# Patient Record
Sex: Female | Born: 1935 | Race: Black or African American | Hispanic: No | Smoking: Never smoker
Health system: Southern US, Community
[De-identification: ages and names within clinical notes are randomized; demographics above are authoritative.]

## PROBLEM LIST (undated history)

## (undated) DIAGNOSIS — G4700 Insomnia, unspecified: Secondary | ICD-10-CM

## (undated) DIAGNOSIS — M1990 Unspecified osteoarthritis, unspecified site: Secondary | ICD-10-CM

## (undated) DIAGNOSIS — M76899 Other specified enthesopathies of unspecified lower limb, excluding foot: Secondary | ICD-10-CM

## (undated) DIAGNOSIS — M8580 Other specified disorders of bone density and structure, unspecified site: Secondary | ICD-10-CM

## (undated) DIAGNOSIS — E785 Hyperlipidemia, unspecified: Secondary | ICD-10-CM

## (undated) DIAGNOSIS — D509 Iron deficiency anemia, unspecified: Secondary | ICD-10-CM

## (undated) DIAGNOSIS — I10 Essential (primary) hypertension: Secondary | ICD-10-CM

## (undated) DIAGNOSIS — N823 Fistula of vagina to large intestine: Secondary | ICD-10-CM

## (undated) HISTORY — DX: Other specified enthesopathies of unspecified lower limb, excluding foot: M76.899

## (undated) HISTORY — PX: KNEE ARTHROSCOPY: SUR90

## (undated) HISTORY — DX: Insomnia, unspecified: G47.00

## (undated) HISTORY — DX: Hyperlipidemia, unspecified: E78.5

## (undated) HISTORY — DX: Essential (primary) hypertension: I10

## (undated) HISTORY — DX: Other specified disorders of bone density and structure, unspecified site: M85.80

## (undated) HISTORY — DX: Fistula of vagina to large intestine: N82.3

## (undated) HISTORY — DX: Unspecified osteoarthritis, unspecified site: M19.90

## (undated) HISTORY — DX: Iron deficiency anemia, unspecified: D50.9

## (undated) HISTORY — PX: CATARACT EXTRACTION, BILATERAL: SHX1313

---

## 1997-11-11 ENCOUNTER — Encounter: Admission: RE | Admit: 1997-11-11 | Discharge: 1998-02-09 | Payer: Self-pay | Admitting: Neurology

## 2002-01-20 ENCOUNTER — Encounter: Admission: RE | Admit: 2002-01-20 | Discharge: 2002-01-20 | Payer: Self-pay | Admitting: Internal Medicine

## 2002-01-28 ENCOUNTER — Encounter: Admission: RE | Admit: 2002-01-28 | Discharge: 2002-01-28 | Payer: Self-pay | Admitting: Internal Medicine

## 2002-01-29 ENCOUNTER — Encounter: Payer: Self-pay | Admitting: Internal Medicine

## 2002-01-29 ENCOUNTER — Ambulatory Visit (HOSPITAL_COMMUNITY): Admission: RE | Admit: 2002-01-29 | Discharge: 2002-01-29 | Payer: Self-pay | Admitting: Internal Medicine

## 2002-03-10 ENCOUNTER — Encounter: Admission: RE | Admit: 2002-03-10 | Discharge: 2002-03-10 | Payer: Self-pay | Admitting: Internal Medicine

## 2002-03-24 ENCOUNTER — Encounter: Admission: RE | Admit: 2002-03-24 | Discharge: 2002-03-24 | Payer: Self-pay | Admitting: Internal Medicine

## 2002-06-09 ENCOUNTER — Encounter: Admission: RE | Admit: 2002-06-09 | Discharge: 2002-06-09 | Payer: Self-pay | Admitting: Internal Medicine

## 2002-06-23 ENCOUNTER — Encounter: Admission: RE | Admit: 2002-06-23 | Discharge: 2002-06-23 | Payer: Self-pay | Admitting: Internal Medicine

## 2002-07-21 ENCOUNTER — Encounter: Admission: RE | Admit: 2002-07-21 | Discharge: 2002-07-21 | Payer: Self-pay | Admitting: Internal Medicine

## 2002-09-21 ENCOUNTER — Ambulatory Visit (HOSPITAL_COMMUNITY): Admission: RE | Admit: 2002-09-21 | Discharge: 2002-09-21 | Payer: Self-pay | Admitting: Gastroenterology

## 2002-09-21 LAB — HM COLONOSCOPY

## 2003-01-18 ENCOUNTER — Encounter: Admission: RE | Admit: 2003-01-18 | Discharge: 2003-01-18 | Payer: Self-pay | Admitting: Internal Medicine

## 2003-01-19 ENCOUNTER — Encounter: Admission: RE | Admit: 2003-01-19 | Discharge: 2003-01-19 | Payer: Self-pay | Admitting: Internal Medicine

## 2003-07-30 ENCOUNTER — Encounter: Admission: RE | Admit: 2003-07-30 | Discharge: 2003-07-30 | Payer: Self-pay | Admitting: Internal Medicine

## 2003-08-02 ENCOUNTER — Ambulatory Visit (HOSPITAL_COMMUNITY): Admission: RE | Admit: 2003-08-02 | Discharge: 2003-08-02 | Payer: Self-pay | Admitting: Family Medicine

## 2003-09-30 ENCOUNTER — Encounter: Admission: RE | Admit: 2003-09-30 | Discharge: 2003-09-30 | Payer: Self-pay | Admitting: Internal Medicine

## 2003-11-02 ENCOUNTER — Encounter: Admission: RE | Admit: 2003-11-02 | Discharge: 2003-11-02 | Payer: Self-pay | Admitting: Internal Medicine

## 2003-11-02 ENCOUNTER — Ambulatory Visit (HOSPITAL_COMMUNITY): Admission: RE | Admit: 2003-11-02 | Discharge: 2003-11-02 | Payer: Self-pay | Admitting: Internal Medicine

## 2003-11-18 ENCOUNTER — Encounter: Admission: RE | Admit: 2003-11-18 | Discharge: 2003-11-18 | Payer: Self-pay | Admitting: Internal Medicine

## 2003-12-24 ENCOUNTER — Ambulatory Visit (HOSPITAL_COMMUNITY): Admission: RE | Admit: 2003-12-24 | Discharge: 2003-12-24 | Payer: Self-pay | Admitting: Internal Medicine

## 2003-12-24 ENCOUNTER — Encounter: Admission: RE | Admit: 2003-12-24 | Discharge: 2003-12-24 | Payer: Self-pay | Admitting: Internal Medicine

## 2004-06-13 ENCOUNTER — Ambulatory Visit: Payer: Self-pay | Admitting: Internal Medicine

## 2004-06-27 ENCOUNTER — Ambulatory Visit: Payer: Self-pay | Admitting: Internal Medicine

## 2004-07-18 ENCOUNTER — Ambulatory Visit: Payer: Self-pay | Admitting: Internal Medicine

## 2004-09-20 ENCOUNTER — Ambulatory Visit: Payer: Self-pay | Admitting: Internal Medicine

## 2004-09-29 ENCOUNTER — Ambulatory Visit (HOSPITAL_COMMUNITY): Admission: RE | Admit: 2004-09-29 | Discharge: 2004-09-29 | Payer: Self-pay | Admitting: Internal Medicine

## 2004-10-04 ENCOUNTER — Ambulatory Visit: Payer: Self-pay | Admitting: Internal Medicine

## 2004-10-13 ENCOUNTER — Ambulatory Visit: Payer: Self-pay | Admitting: Internal Medicine

## 2004-11-17 ENCOUNTER — Ambulatory Visit: Payer: Self-pay | Admitting: Internal Medicine

## 2004-11-30 ENCOUNTER — Encounter: Admission: RE | Admit: 2004-11-30 | Discharge: 2004-12-28 | Payer: Self-pay | Admitting: Internal Medicine

## 2005-02-13 ENCOUNTER — Ambulatory Visit: Payer: Self-pay | Admitting: Internal Medicine

## 2005-05-11 ENCOUNTER — Ambulatory Visit: Payer: Self-pay | Admitting: Internal Medicine

## 2005-09-04 ENCOUNTER — Ambulatory Visit: Payer: Self-pay | Admitting: Internal Medicine

## 2005-10-02 ENCOUNTER — Ambulatory Visit (HOSPITAL_COMMUNITY): Admission: RE | Admit: 2005-10-02 | Discharge: 2005-10-02 | Payer: Self-pay | Admitting: Internal Medicine

## 2006-05-09 ENCOUNTER — Encounter (INDEPENDENT_AMBULATORY_CARE_PROVIDER_SITE_OTHER): Payer: Self-pay | Admitting: Internal Medicine

## 2006-05-09 ENCOUNTER — Ambulatory Visit: Payer: Self-pay | Admitting: Internal Medicine

## 2006-05-09 LAB — CONVERTED CEMR LAB
Ferritin: 126 ng/mL (ref 10–291)
HCT: 40 % (ref 34.4–43.3)
HDL: 55 mg/dL (ref >39)
Hemoglobin: 12.5 g/dL (ref 11.7–14.8)
Iron: 61 ug/dL (ref 42–145)
LDL Cholesterol: 104 mg/dL — ABNORMAL HIGH (ref 0–99)
Leukocyte count, blood: 4.3 10*9/L (ref 3.7–10.0)
RBC: 5.51 M/uL — ABNORMAL HIGH (ref 3.79–4.96)
Saturation Ratios: 22 % (ref 20–55)
TIBC: 280 ug/dL (ref 250–470)
Total CHOL/HDL Ratio: 3.3
UIBC: 219 ug/dL

## 2006-05-14 ENCOUNTER — Ambulatory Visit (HOSPITAL_COMMUNITY): Admission: RE | Admit: 2006-05-14 | Discharge: 2006-05-14 | Payer: Self-pay | Admitting: Internal Medicine

## 2006-05-14 ENCOUNTER — Encounter (INDEPENDENT_AMBULATORY_CARE_PROVIDER_SITE_OTHER): Payer: Self-pay | Admitting: Internal Medicine

## 2006-05-27 DIAGNOSIS — J309 Allergic rhinitis, unspecified: Secondary | ICD-10-CM | POA: Insufficient documentation

## 2006-05-27 DIAGNOSIS — E785 Hyperlipidemia, unspecified: Secondary | ICD-10-CM

## 2006-05-27 DIAGNOSIS — D539 Nutritional anemia, unspecified: Secondary | ICD-10-CM | POA: Insufficient documentation

## 2006-05-27 DIAGNOSIS — M1990 Unspecified osteoarthritis, unspecified site: Secondary | ICD-10-CM | POA: Insufficient documentation

## 2006-05-27 DIAGNOSIS — M779 Enthesopathy, unspecified: Secondary | ICD-10-CM | POA: Insufficient documentation

## 2006-05-27 DIAGNOSIS — M899 Disorder of bone, unspecified: Secondary | ICD-10-CM | POA: Insufficient documentation

## 2006-05-27 DIAGNOSIS — M949 Disorder of cartilage, unspecified: Secondary | ICD-10-CM

## 2006-05-27 DIAGNOSIS — G4700 Insomnia, unspecified: Secondary | ICD-10-CM | POA: Insufficient documentation

## 2006-05-27 DIAGNOSIS — N824 Other female intestinal-genital tract fistulae: Secondary | ICD-10-CM | POA: Insufficient documentation

## 2006-05-27 DIAGNOSIS — I10 Essential (primary) hypertension: Secondary | ICD-10-CM | POA: Insufficient documentation

## 2006-09-09 ENCOUNTER — Telehealth: Payer: Self-pay | Admitting: *Deleted

## 2006-10-03 ENCOUNTER — Ambulatory Visit: Payer: Self-pay | Admitting: Internal Medicine

## 2006-10-03 ENCOUNTER — Encounter (INDEPENDENT_AMBULATORY_CARE_PROVIDER_SITE_OTHER): Payer: Self-pay | Admitting: Internal Medicine

## 2006-10-03 LAB — CONVERTED CEMR LAB
ALT: 18 units/L (ref 0–35)
AST: 22 units/L (ref 0–37)
Albumin: 4.6 g/dL (ref 3.5–5.2)
Alkaline Phosphatase: 116 units/L (ref 39–117)
CO2: 24 meq/L (ref 19–32)
Calcium: 10.1 mg/dL (ref 8.4–10.5)
Chloride: 103 meq/L (ref 96–112)
Creatinine, Ser: 0.83 mg/dL (ref 0.40–1.20)
Glucose, Bld: 83 mg/dL (ref 70–99)
HDL: 55 mg/dL (ref >39)
LDL Cholesterol: 74 mg/dL (ref 0–99)
Total Protein: 7.8 g/dL (ref 6.0–8.3)
Triglycerides: 66 mg/dL (ref <150)

## 2007-03-28 ENCOUNTER — Encounter (INDEPENDENT_AMBULATORY_CARE_PROVIDER_SITE_OTHER): Payer: Self-pay | Admitting: Internal Medicine

## 2007-03-28 ENCOUNTER — Ambulatory Visit: Payer: Self-pay | Admitting: Internal Medicine

## 2007-03-28 DIAGNOSIS — N951 Menopausal and female climacteric states: Secondary | ICD-10-CM | POA: Insufficient documentation

## 2007-03-29 LAB — CONVERTED CEMR LAB
ALT: 18 units/L (ref 0–35)
AST: 21 units/L (ref 0–37)
Albumin: 4.7 g/dL (ref 3.5–5.2)
BUN: 18 mg/dL (ref 6–23)
CO2: 25 meq/L (ref 19–32)
Creatinine, Ser: 1.09 mg/dL (ref 0.40–1.20)
HCT: 35.6 % — ABNORMAL LOW (ref 36.0–46.0)
MCV: 73.6 fL — ABNORMAL LOW (ref 78.0–100.0)
Platelets: 315 10*3/uL (ref 150–400)
RDW: 15.2 % — ABNORMAL HIGH (ref 11.5–14.0)
Total Bilirubin: 0.4 mg/dL (ref 0.3–1.2)
Total Protein: 7.7 g/dL (ref 6.0–8.3)

## 2007-03-31 ENCOUNTER — Ambulatory Visit: Payer: Self-pay | Admitting: Internal Medicine

## 2007-03-31 ENCOUNTER — Encounter (INDEPENDENT_AMBULATORY_CARE_PROVIDER_SITE_OTHER): Payer: Self-pay | Admitting: Internal Medicine

## 2007-03-31 LAB — CONVERTED CEMR LAB
HDL: 53 mg/dL (ref >39)
RBC Folate: 731 ng/mL — ABNORMAL HIGH (ref 180–600)
Saturation Ratios: 26 % (ref 20–55)
Total CHOL/HDL Ratio: 2.3
VLDL: 16 mg/dL (ref 0–40)
Vitamin B-12: 972 pg/mL — ABNORMAL HIGH (ref 211–911)

## 2007-04-14 ENCOUNTER — Ambulatory Visit: Payer: Self-pay | Admitting: *Deleted

## 2007-04-16 ENCOUNTER — Ambulatory Visit: Payer: Self-pay | Admitting: Infectious Diseases

## 2007-09-02 ENCOUNTER — Ambulatory Visit: Payer: Self-pay | Admitting: Internal Medicine

## 2007-09-02 DIAGNOSIS — H9209 Otalgia, unspecified ear: Secondary | ICD-10-CM | POA: Insufficient documentation

## 2007-09-22 ENCOUNTER — Encounter (INDEPENDENT_AMBULATORY_CARE_PROVIDER_SITE_OTHER): Payer: Self-pay | Admitting: Internal Medicine

## 2007-09-22 ENCOUNTER — Ambulatory Visit: Payer: Self-pay | Admitting: Hospitalist

## 2007-09-22 LAB — CONVERTED CEMR LAB
ALT: 20 units/L (ref 0–35)
CO2: 22 meq/L (ref 19–32)
Calcium: 9.6 mg/dL (ref 8.4–10.5)
Chloride: 108 meq/L (ref 96–112)
Cholesterol: 107 mg/dL (ref 0–200)
Creatinine, Ser: 0.87 mg/dL (ref 0.40–1.20)
Glucose, Bld: 92 mg/dL (ref 70–99)
Total CHOL/HDL Ratio: 2
Total Protein: 7.4 g/dL (ref 6.0–8.3)
Triglycerides: 48 mg/dL (ref <150)

## 2008-02-12 ENCOUNTER — Telehealth (INDEPENDENT_AMBULATORY_CARE_PROVIDER_SITE_OTHER): Payer: Self-pay | Admitting: Internal Medicine

## 2008-02-24 ENCOUNTER — Encounter (INDEPENDENT_AMBULATORY_CARE_PROVIDER_SITE_OTHER): Payer: Self-pay | Admitting: Internal Medicine

## 2008-04-06 ENCOUNTER — Encounter (INDEPENDENT_AMBULATORY_CARE_PROVIDER_SITE_OTHER): Payer: Self-pay | Admitting: Internal Medicine

## 2008-04-06 ENCOUNTER — Ambulatory Visit: Payer: Self-pay | Admitting: Internal Medicine

## 2008-04-27 ENCOUNTER — Telehealth (INDEPENDENT_AMBULATORY_CARE_PROVIDER_SITE_OTHER): Payer: Self-pay | Admitting: Internal Medicine

## 2008-10-06 ENCOUNTER — Ambulatory Visit: Payer: Self-pay | Admitting: Internal Medicine

## 2008-10-06 ENCOUNTER — Encounter (INDEPENDENT_AMBULATORY_CARE_PROVIDER_SITE_OTHER): Payer: Self-pay | Admitting: Internal Medicine

## 2008-10-07 LAB — CONVERTED CEMR LAB
Albumin: 4.7 g/dL (ref 3.5–5.2)
Alkaline Phosphatase: 100 units/L (ref 39–117)
CO2: 22 meq/L (ref 19–32)
Chloride: 103 meq/L (ref 96–112)
GFR calc non Af Amer: 60 mL/min — ABNORMAL LOW (ref >60)
Glucose, Bld: 82 mg/dL (ref 70–99)
HCT: 35.3 % — ABNORMAL LOW (ref 36.0–46.0)
Platelets: 279 10*3/uL (ref 150–400)
Potassium: 3.9 meq/L (ref 3.5–5.3)
RDW: 14.6 % (ref 11.5–15.5)
Sodium: 140 meq/L (ref 135–145)
Total Protein: 7.7 g/dL (ref 6.0–8.3)

## 2008-10-18 ENCOUNTER — Ambulatory Visit (HOSPITAL_COMMUNITY): Admission: RE | Admit: 2008-10-18 | Discharge: 2008-10-18 | Payer: Self-pay | Admitting: Internal Medicine

## 2008-11-08 ENCOUNTER — Encounter (INDEPENDENT_AMBULATORY_CARE_PROVIDER_SITE_OTHER): Payer: Self-pay | Admitting: Internal Medicine

## 2008-11-08 ENCOUNTER — Ambulatory Visit: Payer: Self-pay | Admitting: Internal Medicine

## 2008-11-08 LAB — CONVERTED CEMR LAB
LDL Cholesterol: 67 mg/dL (ref 0–99)
VLDL: 12 mg/dL (ref 0–40)

## 2009-07-12 ENCOUNTER — Telehealth: Payer: Self-pay | Admitting: Internal Medicine

## 2009-10-25 ENCOUNTER — Ambulatory Visit (HOSPITAL_COMMUNITY): Admission: RE | Admit: 2009-10-25 | Discharge: 2009-10-25 | Payer: Self-pay | Admitting: Internal Medicine

## 2010-08-08 NOTE — Progress Notes (Signed)
Summary: refill/ hla  Phone Note Refill Request Message from:  Patient on July 12, 2009 4:24 PM  Refills Requested: Medication #1:  QUINAPRIL HCL 40 MG TABS Take 1 tablet by mouth daily Initial call taken by: Marin Roberts RN,  July 12, 2009 4:24 PM  Follow-up for Phone Call       Follow-up by: Blondell Reveal MD,  July 29, 2009 7:24 AM    Prescriptions: QUINAPRIL HCL 40 MG TABS (QUINAPRIL HCL) Take 1 tablet by mouth daily  #30 x 5   Entered and Authorized by:   Blondell Reveal MD   Signed by:   Blondell Reveal MD on 07/29/2009   Method used:   Electronically to        [facility] [address] [phone]* (retail)       [address]       [place], [place]  [address]       Ph: [phone]       Fax: [phone]   RxID:   [id number]

## 2010-08-14 ENCOUNTER — Ambulatory Visit: Payer: Medicare PPO | Attending: Physical Therapy | Admitting: Physical Therapy

## 2010-10-27 ENCOUNTER — Encounter: Payer: Self-pay | Admitting: Ophthalmology

## 2010-11-15 ENCOUNTER — Other Ambulatory Visit (HOSPITAL_COMMUNITY): Payer: Self-pay | Admitting: Internal Medicine

## 2010-11-15 DIAGNOSIS — Z1231 Encounter for screening mammogram for malignant neoplasm of breast: Secondary | ICD-10-CM

## 2010-11-24 ENCOUNTER — Ambulatory Visit (HOSPITAL_COMMUNITY)
Admission: RE | Admit: 2010-11-24 | Discharge: 2010-11-24 | Disposition: A | Discharge status: Home / Self Care | Payer: Medicare PPO | Source: Ambulatory Visit | Attending: Internal Medicine | Admitting: Internal Medicine

## 2010-11-24 DIAGNOSIS — Z1231 Encounter for screening mammogram for malignant neoplasm of breast: Secondary | ICD-10-CM | POA: Insufficient documentation

## 2010-11-24 NOTE — Op Note (Signed)
   NAME:  [name]                   ACCOUNT NO.:  [id number]   MEDICAL RECORD NO.:  [id number]                   PATIENT TYPE:  AMB   LOCATION:  ENDO                                 FACILITY:  [facility]   PHYSICIAN:  James L. Malon Kindle., M.D.          DATE OF BIRTH:  [birth year]   DATE OF PROCEDURE:  09/21/2002  DATE OF DISCHARGE:                                 OPERATIVE REPORT   PROCEDURE:  Colonoscopy.   MEDICATIONS:  Fentanyl 100 mcg, Versed 10 mg IV.   INDICATIONS:  Colon cancer screening.   ENDOSCOPE:  Olympus pediatric adjustable colonoscope.   DESCRIPTION OF PROCEDURE:  The procedure had been explained to the patient  and consent obtained.  With the patient in the left lateral decubitus  position, we attempted to insert the scope.  Multiple problems were  encountered.  We had problems finding the rectum and continued going in the  vagina even with a digital exam.  After finally being able to have the  patient bring her legs up, carefully inspect the area with a digital finger,  it was determined that the patient's rectum and vagina had a common channel.  We were then subsequently able to enter the rectum and were able to advance  easily to the cecum using abdominal pressure and position changes.  The  scope was withdrawn and the cecum, ascending colon, transverse colon,  descending and sigmoid colon were seen well.  No polyps or other lesions  seen.  We came down in the rectum, and the rectum was free of polyps.  As we  withdrew the scope from the rectum, the patient was seen to have a common  channel with two lumens, and this was photographed.  No other lesions were  seen in the rectum.  The scope was withdrawn.  The patient tolerated the  procedure well, was maintained on low-flow oxygen and pulse oximeter  throughout the procedure.   ASSESSMENT:  1. No evidence of colon polyps or other lesions.  2. Common rectovaginal channel.   PLAN:  Will plan to have  the patient see Ladell Pier, M.D., at the [facility] for a pelvic exam.                                               Fayrene Fearing L. Malon Kindle., M.D.    Waldron Session  D:  09/21/2002  T:  09/21/2002  Job:  [id number]   cc:   Ladell Pier, M.D.  [address]-Internal Medicine Resident  [place], [place] [address]  Fax: [phone]

## 2010-12-06 ENCOUNTER — Emergency Department (HOSPITAL_COMMUNITY): Payer: Medicare PPO

## 2010-12-06 ENCOUNTER — Emergency Department (HOSPITAL_COMMUNITY)
Admission: EM | Admit: 2010-12-06 | Discharge: 2010-12-06 | Disposition: A | Discharge status: Home / Self Care | Payer: Medicare PPO | Attending: Emergency Medicine | Admitting: Emergency Medicine

## 2010-12-06 DIAGNOSIS — R5383 Other fatigue: Secondary | ICD-10-CM | POA: Insufficient documentation

## 2010-12-06 DIAGNOSIS — R42 Dizziness and giddiness: Secondary | ICD-10-CM | POA: Insufficient documentation

## 2010-12-06 DIAGNOSIS — R5381 Other malaise: Secondary | ICD-10-CM | POA: Insufficient documentation

## 2010-12-06 DIAGNOSIS — I10 Essential (primary) hypertension: Secondary | ICD-10-CM | POA: Insufficient documentation

## 2010-12-06 DIAGNOSIS — H81399 Other peripheral vertigo, unspecified ear: Secondary | ICD-10-CM | POA: Insufficient documentation

## 2010-12-06 LAB — POCT I-STAT, CHEM 8
Calcium, Ion: 1.16 mmol/L (ref 1.12–1.32)
Chloride: 106 mEq/L (ref 96–112)
Creatinine, Ser: 1.1 mg/dL (ref 0.4–1.2)
Glucose, Bld: 97 mg/dL (ref 70–99)
HCT: 35 % — ABNORMAL LOW (ref 36.0–46.0)

## 2011-07-28 ENCOUNTER — Emergency Department (HOSPITAL_COMMUNITY): Payer: No Typology Code available for payment source

## 2011-07-28 ENCOUNTER — Emergency Department (HOSPITAL_COMMUNITY)
Admission: EM | Admit: 2011-07-28 | Discharge: 2011-07-28 | Disposition: A | Discharge status: Home / Self Care | Payer: No Typology Code available for payment source | Attending: Emergency Medicine | Admitting: Emergency Medicine

## 2011-07-28 DIAGNOSIS — E785 Hyperlipidemia, unspecified: Secondary | ICD-10-CM | POA: Insufficient documentation

## 2011-07-28 DIAGNOSIS — Z79899 Other long term (current) drug therapy: Secondary | ICD-10-CM | POA: Insufficient documentation

## 2011-07-28 DIAGNOSIS — I10 Essential (primary) hypertension: Secondary | ICD-10-CM | POA: Insufficient documentation

## 2011-07-28 DIAGNOSIS — M5380 Other specified dorsopathies, site unspecified: Secondary | ICD-10-CM | POA: Insufficient documentation

## 2011-07-28 DIAGNOSIS — M545 Low back pain, unspecified: Secondary | ICD-10-CM | POA: Insufficient documentation

## 2011-07-28 DIAGNOSIS — M1990 Unspecified osteoarthritis, unspecified site: Secondary | ICD-10-CM | POA: Insufficient documentation

## 2011-07-28 DIAGNOSIS — M542 Cervicalgia: Secondary | ICD-10-CM | POA: Insufficient documentation

## 2011-07-28 MED ORDER — SODIUM CHLORIDE 0.9 % IV BOLUS (SEPSIS)
1000.0000 mL | Freq: Once | INTRAVENOUS | Status: AC
  Administered 2011-07-28: 1000 mL via INTRAVENOUS

## 2011-07-28 MED ORDER — METHOCARBAMOL 500 MG PO TABS: 500.0000 mg | ORAL_TABLET | Freq: Two times a day (BID) | ORAL | Status: AC

## 2011-07-28 MED ORDER — MORPHINE SULFATE 2 MG/ML IJ SOLN
2.0000 mg | Freq: Once | INTRAMUSCULAR | Status: AC
  Administered 2011-07-28: 2 mg via INTRAVENOUS
  Filled 2011-07-28: qty 1

## 2011-07-28 MED ORDER — OXYCODONE-ACETAMINOPHEN 5-325 MG PO TABS: 2.0000 | ORAL_TABLET | ORAL | Status: AC | PRN
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
## 2011-07-28 NOTE — ED Notes (Signed)
Family at bedside. 
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
## 2011-08-20 ENCOUNTER — Other Ambulatory Visit: Payer: Self-pay | Admitting: Internal Medicine

## 2011-08-20 DIAGNOSIS — M5430 Sciatica, unspecified side: Secondary | ICD-10-CM

## 2011-08-20 DIAGNOSIS — M25552 Pain in left hip: Secondary | ICD-10-CM

## 2011-08-24 ENCOUNTER — Other Ambulatory Visit: Payer: Medicare PPO

## 2011-08-24 ENCOUNTER — Inpatient Hospital Stay: Admission: RE | Admit: 2011-08-24 | Payer: Medicare PPO | Source: Ambulatory Visit

## 2011-10-25 ENCOUNTER — Ambulatory Visit
Admission: RE | Admit: 2011-10-25 | Discharge: 2011-10-25 | Disposition: A | Discharge status: Home / Self Care | Payer: Medicare PPO | Source: Ambulatory Visit | Attending: Internal Medicine | Admitting: Internal Medicine

## 2011-10-25 DIAGNOSIS — M25552 Pain in left hip: Secondary | ICD-10-CM

## 2011-10-25 DIAGNOSIS — M5430 Sciatica, unspecified side: Secondary | ICD-10-CM

## 2012-05-16 ENCOUNTER — Emergency Department (INDEPENDENT_AMBULATORY_CARE_PROVIDER_SITE_OTHER)
Admission: EM | Admit: 2012-05-16 | Discharge: 2012-05-16 | Disposition: A | Discharge status: Home / Self Care | Payer: Medicare PPO | Source: Home / Self Care

## 2012-05-16 ENCOUNTER — Encounter (HOSPITAL_COMMUNITY): Payer: Self-pay | Admitting: Emergency Medicine

## 2012-05-16 ENCOUNTER — Emergency Department (INDEPENDENT_AMBULATORY_CARE_PROVIDER_SITE_OTHER): Payer: Medicare PPO

## 2012-05-16 DIAGNOSIS — L03019 Cellulitis of unspecified finger: Secondary | ICD-10-CM

## 2012-05-16 DIAGNOSIS — S6000XA Contusion of unspecified finger without damage to nail, initial encounter: Secondary | ICD-10-CM

## 2012-05-16 DIAGNOSIS — S60229A Contusion of unspecified hand, initial encounter: Secondary | ICD-10-CM

## 2012-05-16 DIAGNOSIS — L03011 Cellulitis of right finger: Secondary | ICD-10-CM

## 2012-05-16 MED ORDER — CEPHALEXIN 500 MG PO CAPS: 500.0000 mg | ORAL_CAPSULE | Freq: Three times a day (TID) | ORAL | Status: DC
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
## 2012-05-16 NOTE — ED Notes (Signed)
Patient reports falling one week ago.  Says she was coming into house, lost balance and fell.  Reports right hand pain since then, right palm and right thumb pain, with minimal discoloration to tip of thumb

## 2012-05-18 NOTE — ED Provider Notes (Signed)
Medical screening examination/treatment/procedure(s) were performed by non-physician practitioner and as supervising physician I was immediately available for consultation/collaboration.   MORENO-COLL,Cathy Crounse; MD   Arzu Mcgaughey Moreno-Coll, MD 05/18/12 0828 

## 2012-11-19 ENCOUNTER — Other Ambulatory Visit: Payer: Self-pay | Admitting: Internal Medicine

## 2012-11-19 ENCOUNTER — Ambulatory Visit
Admission: RE | Admit: 2012-11-19 | Discharge: 2012-11-19 | Disposition: A | Discharge status: Home / Self Care | Payer: Medicare PPO | Source: Ambulatory Visit | Attending: Internal Medicine | Admitting: Internal Medicine

## 2012-11-19 DIAGNOSIS — R52 Pain, unspecified: Secondary | ICD-10-CM

## 2013-10-29 IMAGING — CR DG CHEST 2V
2 series · 2 of 2 positions shown · non-contrast
Comparison: None

CLINICAL DATA: Right rib pain, no injury

CHEST - 2 VIEW

[w chest pa]
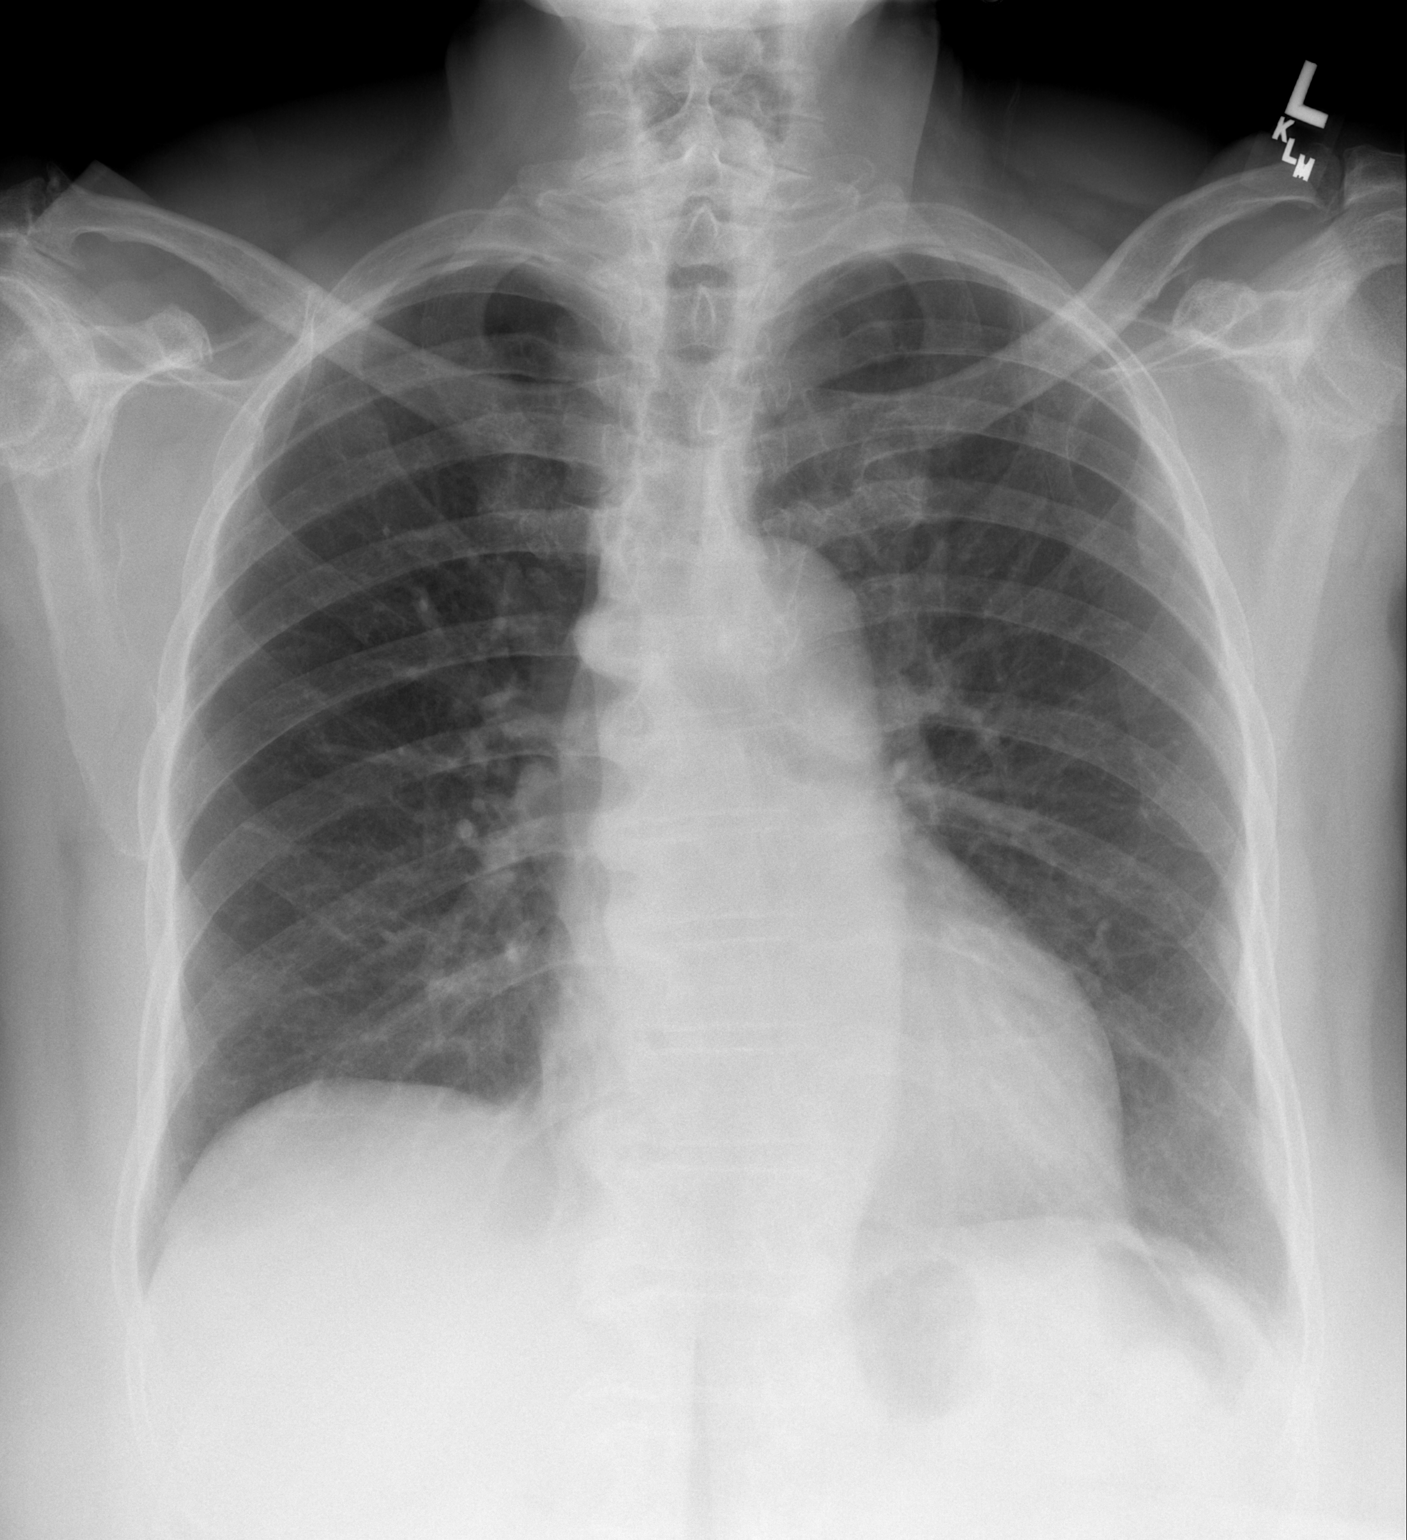

[w chest lat]
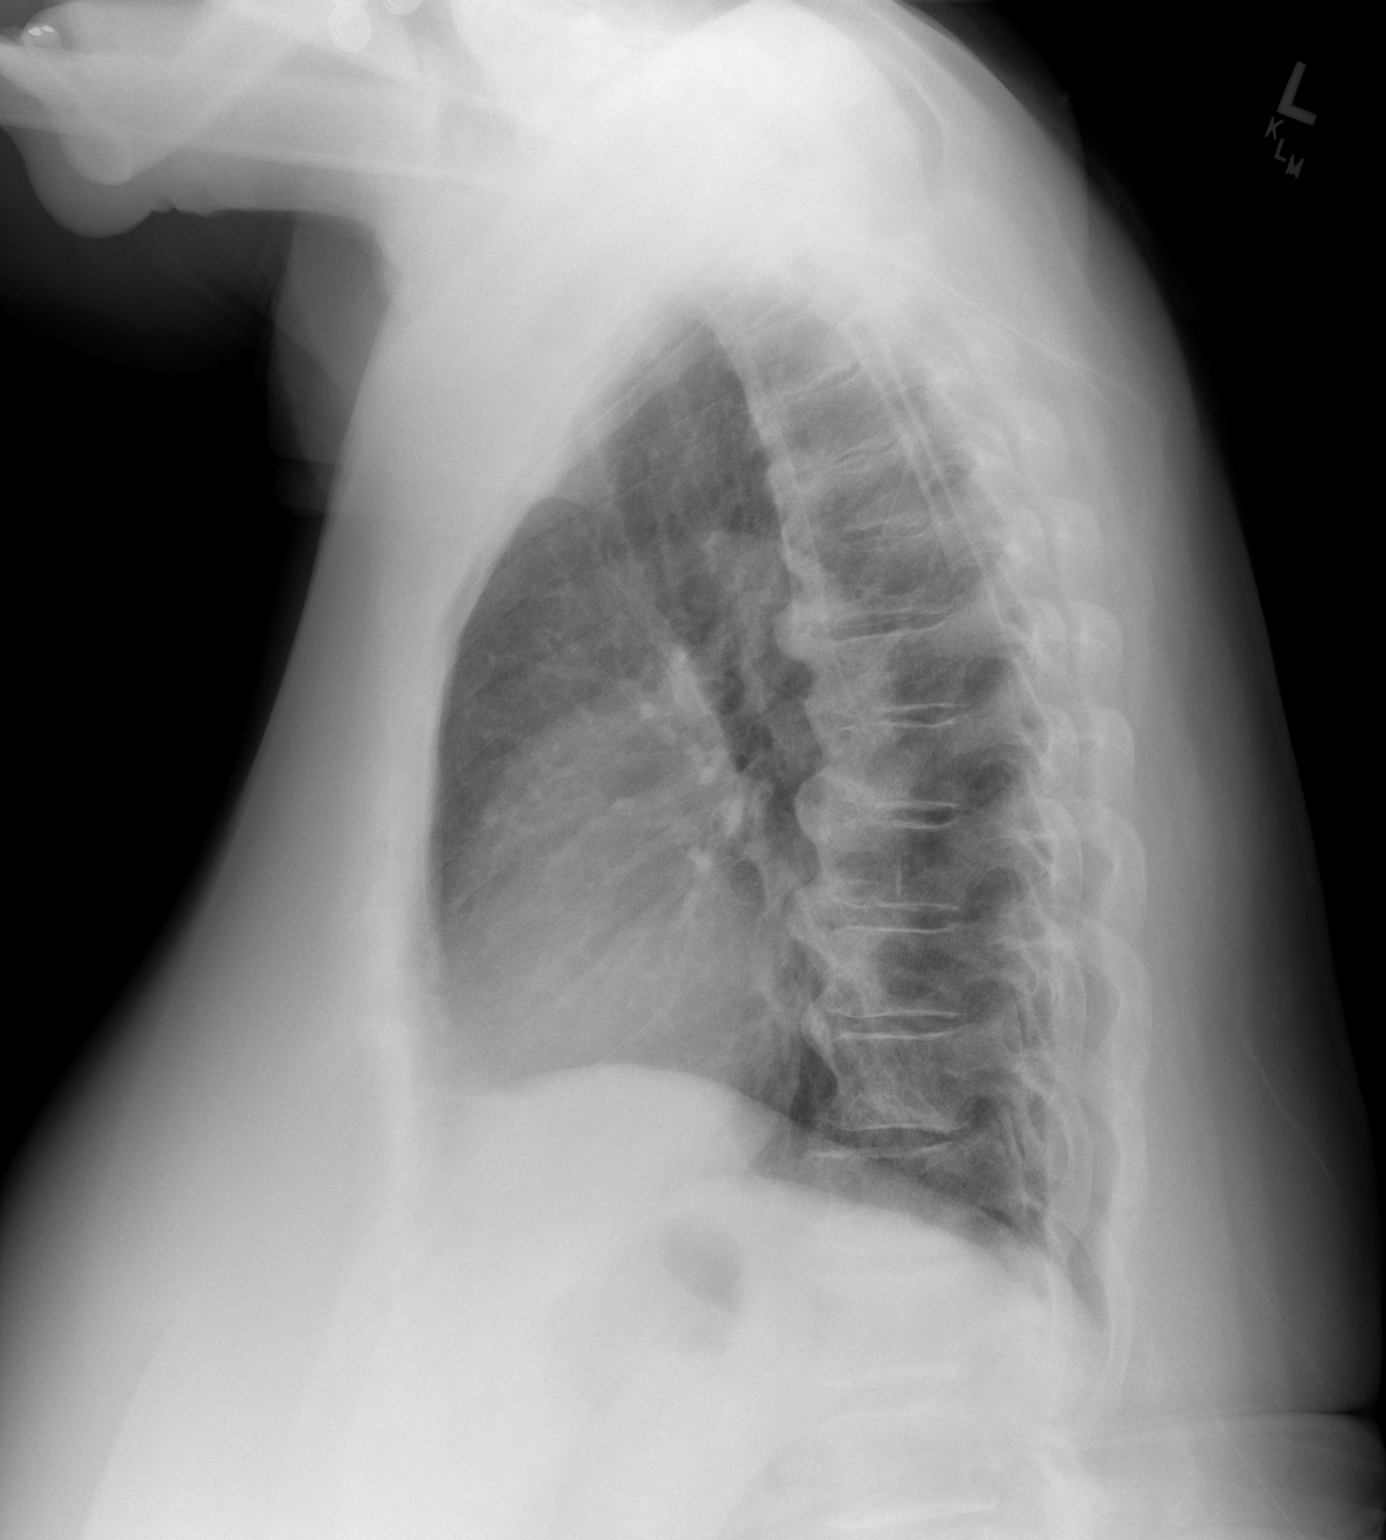

[2 of 2 positions shown; findings below may reference images not displayed]

FINDINGS: The lungs are clear.  Mild cardiomegaly is noted.  There
are degenerative changes throughout the thoracic spine.
IMPRESSION: No active lung disease.  Mild cardiomegaly.

## 2014-08-16 ENCOUNTER — Ambulatory Visit
Admission: RE | Admit: 2014-08-16 | Discharge: 2014-08-16 | Disposition: A | Discharge status: Home / Self Care | Payer: Medicare PPO | Source: Ambulatory Visit | Attending: Internal Medicine | Admitting: Internal Medicine

## 2014-08-16 ENCOUNTER — Other Ambulatory Visit: Payer: Self-pay | Admitting: Internal Medicine

## 2014-08-16 DIAGNOSIS — M25552 Pain in left hip: Secondary | ICD-10-CM

## 2015-07-26 IMAGING — CR DG HIP (WITH OR WITHOUT PELVIS) 2-3V*L*
2 series · 2 of 2 positions shown · non-contrast
Comparison: MRI 10/25/2011

CLINICAL DATA: Fell 3 weeks ago.  Persistent left hip pain.

EXAM:
LEFT HIP (WITH PELVIS) 2-3 VIEWS

[t pelvis a.p.]
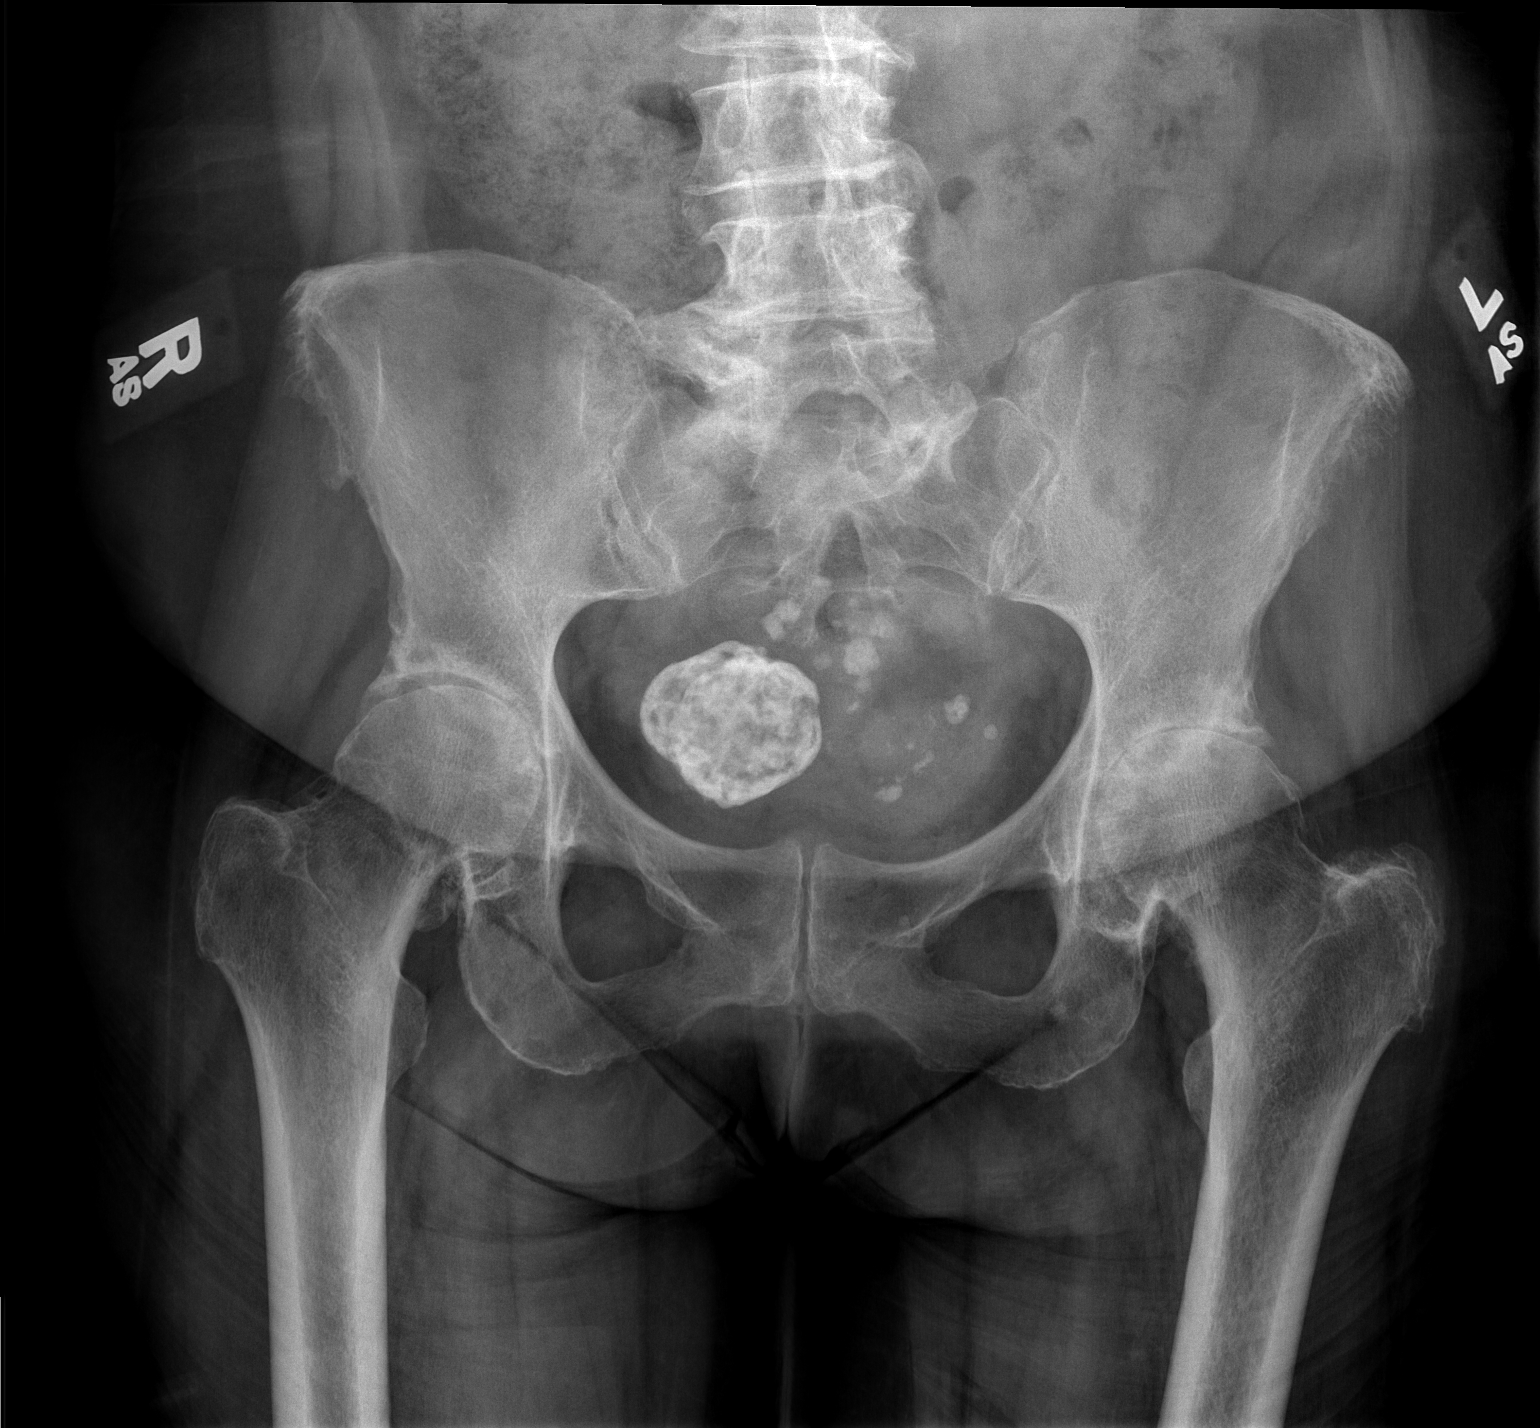

[t hip frog leg left]
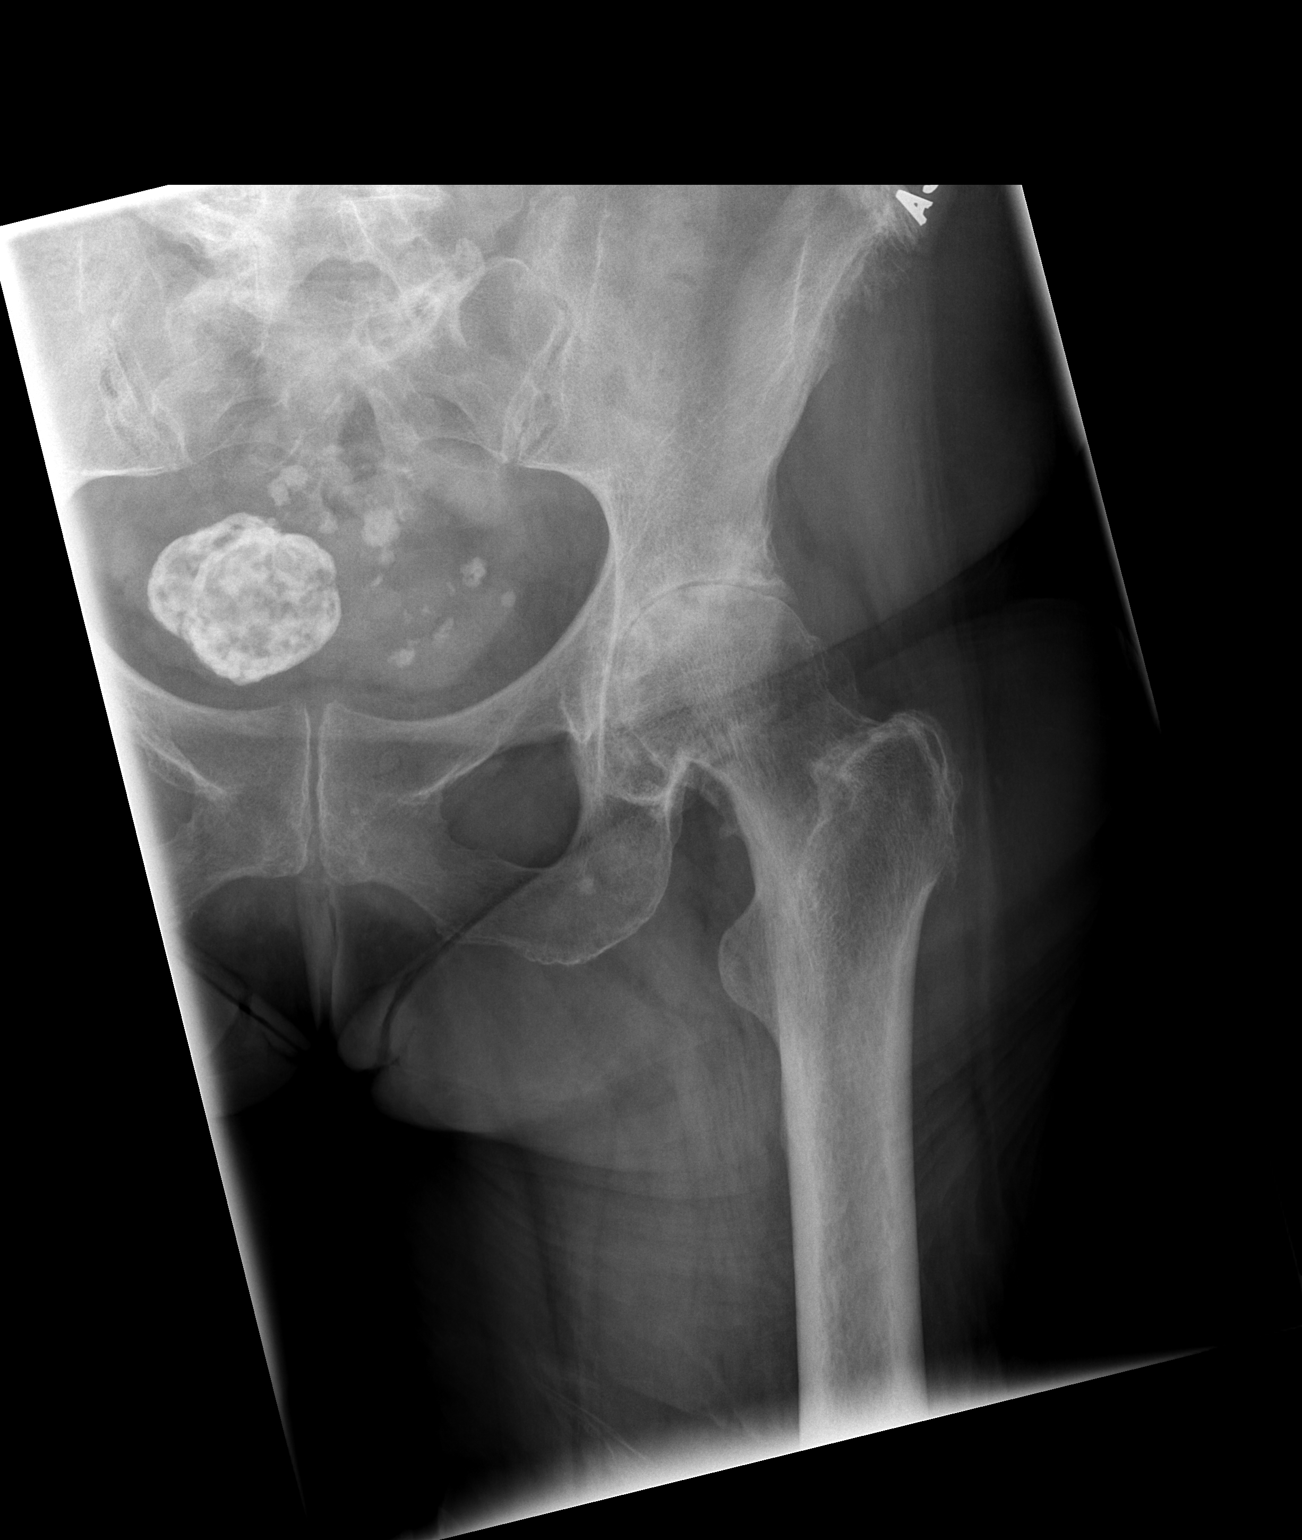

[2 of 2 positions shown; findings below may reference images not displayed]

FINDINGS: Advanced bilateral hip joint degenerative changes, left greater than
right with marked joint space narrowing, osteophytic spurring and
subchondral cystic change. No acute fracture. No findings for
avascular necrosis. The pubic symphysis and SI joints are intact. No
definite pelvic fractures. Calcified uterine fibroids are noted.
IMPRESSION: Advanced bilateral hip joint degenerative changes, left greater than
right.

No acute fracture.
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
## 2020-05-20 ENCOUNTER — Ambulatory Visit: Payer: Self-pay | Admitting: Rheumatology

## 2020-05-24 ENCOUNTER — Ambulatory Visit: Payer: Self-pay | Admitting: Rheumatology

## 2020-05-24 DIAGNOSIS — M79642 Pain in left hand: Secondary | ICD-10-CM

## 2020-05-24 DIAGNOSIS — G4709 Other insomnia: Secondary | ICD-10-CM

## 2020-05-24 DIAGNOSIS — D509 Iron deficiency anemia, unspecified: Secondary | ICD-10-CM

## 2020-05-24 DIAGNOSIS — M8580 Other specified disorders of bone density and structure, unspecified site: Secondary | ICD-10-CM

## 2020-05-24 DIAGNOSIS — Z8639 Personal history of other endocrine, nutritional and metabolic disease: Secondary | ICD-10-CM

## 2020-05-24 DIAGNOSIS — I10 Essential (primary) hypertension: Secondary | ICD-10-CM

## 2020-06-14 ENCOUNTER — Ambulatory Visit: Payer: Self-pay | Admitting: Rheumatology

## 2020-08-01 NOTE — Progress Notes (Signed)
Office Visit Note  Patient: [name]             Date of Birth: September 23, 1933           MRN: [id number]             PCP: Rometta Emery, MD Referring: Rometta Emery, MD Visit Date: 08/02/2020 Occupation: @GUAROCC @  Subjective:  Pain in both hands.   History of Present Illness: [name] is a 87 y.o. female seen in consultation per request of Dr. 83.  According to the patient for the last 2 years she has been having increased pain and discomfort in her bilateral hands.  She also notices intermittent swelling.  She has been having discomfort in her bilateral hip joints.  Recent x-ray showed severe osteoarthritis.  She states she was advised total hip replacement.  She also has seen orthopedic surgeons in the past for her knee joints.  She has had arthroscopic surgery on her knee joints.  She has chronic lower back discomfort and has been diagnosed with degenerative disc disease.  She denies any radiculopathy.  She mobilizes with the help of her walker.  Is no family history of autoimmune disease.  Is gravida 2, para 2, miscarriages 0  Activities of Daily Living:  Patient reports morning stiffness for several hours.   Patient Reports nocturnal pain.  Difficulty dressing/grooming: Reports Difficulty climbing stairs: Reports Difficulty getting out of chair: Reports Difficulty using hands for taps, buttons, cutlery, and/or writing: Reports  Review of Systems  Constitutional: Positive for fatigue.  HENT: Negative for mouth sores, mouth dryness and nose dryness.   Eyes: Positive for itching. Negative for pain and dryness.  Respiratory: Negative for shortness of breath and difficulty breathing.   Cardiovascular: Negative for chest pain and palpitations.  Gastrointestinal: Negative for blood in stool, constipation and diarrhea.  Endocrine: Negative for increased urination.  Genitourinary: Negative for difficulty urinating.  Musculoskeletal: Positive for  arthralgias, joint pain, joint swelling, myalgias, muscle weakness, morning stiffness, muscle tenderness and myalgias.  Skin: Negative for color change, rash and redness.  Allergic/Immunologic: Negative for susceptible to infections.  Neurological: Positive for numbness. Negative for dizziness, headaches and memory loss.  Hematological: Positive for bruising/bleeding tendency.  Psychiatric/Behavioral: Positive for sleep disturbance. Negative for confusion.    PMFS History:  Patient Active Problem List   Diagnosis Date Noted  . EAR PAIN, RIGHT 09/02/2007  . POSTMENOPAUSAL STATUS 03/28/2007  . HYPERLIPIDEMIA 05/27/2006  . ANEMIA, MICROCYTIC 05/27/2006  . HYPERTENSION 05/27/2006  . ALLERGIC RHINITIS 05/27/2006  . RECTOVAGINAL FISTULA 05/27/2006  . OSTEOARTHRITIS 05/27/2006  . HAMSTRING TENDINITIS 05/27/2006  . OSTEOPENIA 05/27/2006  . INSOMNIA 05/27/2006    Past Medical History:  Diagnosis Date  . Allergic rhinitis   . Allergic rhinitis   . Hamstring tendonitis at origin    Right hamstring  . Hyperlipidemia   . Hypertension    Well controlled.   . Insomnia   . Microcytic anemia    Baseline about 11.  MCV approx 70. Ferritin 196 in 9/08  . Osteoarthritis    Bilateral knees, x ray 6/05:  Right knee- Severe tricompartmental degenerative arthritis with loose bodies.  . Osteopenia    Hx, no longer osteopenic,  normal DEXA 11/07  . Rectovaginal fistula    Seen during colonoscopy    Family History  Problem Relation Age of Onset  . Breast cancer Mother   . Breast cancer Sister   . Cancer Brother   . Healthy  Son    Past Surgical History:  Procedure Laterality Date  . CATARACT EXTRACTION, BILATERAL    . KNEE ARTHROSCOPY Bilateral    Social History   Social History Narrative  . Not on file   Immunization History  Administered Date(s) Administered  . Influenza Whole 05/11/2006, 04/16/2007, 04/06/2008  . PFIZER(Purple Top)SARS-COV-2 Vaccination 08/21/2019, 09/15/2019,  05/24/2020     Objective: Vital Signs: BP (!) 179/88 (BP Location: Right Arm, Patient Position: Sitting, Cuff Size: Normal)   Pulse 85    Physical Exam Vitals and nursing note reviewed.  Constitutional:      Appearance: She is well-developed and well-nourished.  HENT:     Head: Normocephalic and atraumatic.  Eyes:     Extraocular Movements: EOM normal.     Conjunctiva/sclera: Conjunctivae normal.  Cardiovascular:     Rate and Rhythm: Normal rate and regular rhythm.     Pulses: Intact distal pulses.     Heart sounds: Normal heart sounds.  Pulmonary:     Effort: Pulmonary effort is normal.     Breath sounds: Normal breath sounds.  Abdominal:     General: Bowel sounds are normal.     Palpations: Abdomen is soft.  Musculoskeletal:     Cervical back: Normal range of motion.  Lymphadenopathy:     Cervical: No cervical adenopathy.  Skin:    General: Skin is warm and dry.     Capillary Refill: Capillary refill takes less than 2 seconds.  Neurological:     Mental Status: She is alert and oriented to person, place, and time.  Psychiatric:        Mood and Affect: Mood and affect normal.        Behavior: Behavior normal.      Musculoskeletal Exam: C-spine was in good range of motion.  Shoulder joints and elbow joints with good range of motion.  Wrist joints and MCPs with good range of motion with no synovitis.  She has bilateral PIP and DIP thickening with no synovitis.  Hip joints were difficult to assess in the sitting position.  She had braces on her bilateral knee joints and discomfort range of motion.  She had no tenderness over ankles or MTPs.  CDAI Exam: CDAI Score: -- Patient Global: --; Provider Global: -- Swollen: --; Tender: -- Joint Exam 08/02/2020   No joint exam has been documented for this visit   There is currently no information documented on the homunculus. Go to the Rheumatology activity and complete the homunculus joint exam.  Investigation: No additional  findings.  Imaging: XR Hand 2 View Left  Result Date: 08/02/2020 Severe CMC narrowing and subluxation was noted.  PIP and DIP narrowing was noted.  No MCP, intercarpal or radiocarpal joint space narrowing was noted. Impression: These findings are consistent with osteoarthritis of the hand.  XR Hand 2 View Right  Result Date: 08/02/2020 Severe CMC narrowing was noted.  First and third MCP narrowing was noted.  PIP and DIP severe narrowing was noted.  No intercarpal radiocarpal joint space narrowing was noted. Impression: These findings are consistent with osteoarthritis of the hand.  The MCP narrowing can be seen inflammatory arthritis.   Recent Labs: Lab Results  Component Value Date   WBC 6.9 10/06/2008   HGB 11.9 (L) 12/06/2010   PLT 279 10/06/2008   NA 140 12/06/2010   K 3.5 12/06/2010   CL 106 12/06/2010   CO2 22 10/06/2008   GLUCOSE 97 12/06/2010   BUN 21 12/06/2010  CREATININE 1.10 12/06/2010   BILITOT 0.4 10/06/2008   ALKPHOS 100 10/06/2008   AST 19 10/06/2008   ALT 16 10/06/2008   PROT 7.7 10/06/2008   ALBUMIN 4.7 10/06/2008   CALCIUM 10.3 10/06/2008   GFRAA >60 10/06/2008    Speciality Comments: No specialty comments available.  Procedures:  No procedures performed Allergies: Penicillins and Sulfonamide derivatives   Assessment / Plan:     Visit Diagnoses: Pain in both hands -patient complains of pain and discomfort in her bilateral hands.  She also gives history of intermittent swelling.  No synovitis was noted.  PIP and DIP thickening was noted.  Clinical findings are consistent with osteoarthritis.  Plan: XR Hand 2 View Right, XR Hand 2 View Left.  X-rays were consistent with severe osteoarthritis.  Her right first and third MCP narrowing was noted which raises concern of inflammatory arthritis.  I will obtain some labs today.  Primary osteoarthritis of both hips -she complains of discomfort in her bilateral hip joints.  I reviewed x-rays which are consistent  with osteoarthritis.  Bilateral end-stage osteoarthritis.  Primary osteoarthritis of both knees-patient has been seen by orthopedics in the past.  She states she has had arthroscopic surgeries on her knees.  She has limited extension with discomfort.  I do not have x-rays to review.  DDD (degenerative disc disease), lumbar -she complains of chronic lower back pain.  I reviewed MRI of her lumbar spine which was consistent with multilevel spondylosis, facet joint arthropathy status and spinal stenosis.  Osteopenia, unspecified location-she had DEXA scan in the past.  She is currently taking calcium and vitamin D as needed.  Other insomnia  Essential hypertension-her blood pressure is elevated today.  Have advised her to monitor blood pressure closely and follow-up with her PCP.  History of hyperlipidemia  Microcytic anemia  Rectal vaginal fistula - s/p surgery  Orders: Orders Placed This Encounter  Procedures  . XR Hand 2 View Right  . XR Hand 2 View Left  . Rheumatoid factor  . Cyclic citrul peptide antibody, IgG  . Iron, TIBC and Ferritin Panel  . Uric acid   No orders of the defined types were placed in this encounter.    Follow-Up Instructions: Return for Osteoarthritis.   Pollyann Savoy, MD  Note - This record has been created using Animal nutritionist.  Chart creation errors have been sought, but may not always  have been located. Such creation errors do not reflect on  the standard of medical care.

## 2020-08-02 ENCOUNTER — Ambulatory Visit: Payer: Self-pay

## 2020-08-02 ENCOUNTER — Other Ambulatory Visit: Payer: Self-pay | Admitting: Rheumatology

## 2020-08-02 ENCOUNTER — Ambulatory Visit: Payer: Medicare Other | Admitting: Rheumatology

## 2020-08-02 ENCOUNTER — Encounter: Payer: Self-pay | Admitting: Rheumatology

## 2020-08-02 ENCOUNTER — Other Ambulatory Visit: Payer: Self-pay

## 2020-08-02 VITALS — BP 179/88 | HR 85 | Ht 63.0 in | Wt 182.4 lb

## 2020-08-02 DIAGNOSIS — N823 Fistula of vagina to large intestine: Secondary | ICD-10-CM

## 2020-08-02 DIAGNOSIS — M79642 Pain in left hand: Secondary | ICD-10-CM

## 2020-08-02 DIAGNOSIS — M79641 Pain in right hand: Secondary | ICD-10-CM | POA: Diagnosis not present

## 2020-08-02 DIAGNOSIS — M170 Bilateral primary osteoarthritis of knee: Secondary | ICD-10-CM | POA: Diagnosis not present

## 2020-08-02 DIAGNOSIS — D509 Iron deficiency anemia, unspecified: Secondary | ICD-10-CM

## 2020-08-02 DIAGNOSIS — I10 Essential (primary) hypertension: Secondary | ICD-10-CM

## 2020-08-02 DIAGNOSIS — G4709 Other insomnia: Secondary | ICD-10-CM

## 2020-08-02 DIAGNOSIS — M160 Bilateral primary osteoarthritis of hip: Secondary | ICD-10-CM | POA: Diagnosis not present

## 2020-08-02 DIAGNOSIS — M5136 Other intervertebral disc degeneration, lumbar region: Secondary | ICD-10-CM | POA: Diagnosis not present

## 2020-08-02 DIAGNOSIS — Z8639 Personal history of other endocrine, nutritional and metabolic disease: Secondary | ICD-10-CM

## 2020-08-02 DIAGNOSIS — M8580 Other specified disorders of bone density and structure, unspecified site: Secondary | ICD-10-CM

## 2020-08-03 LAB — IRON,TIBC AND FERRITIN PANEL
%SAT: 22 % (calc) (ref 16–45)
Ferritin: 63 ng/mL (ref 16–288)
Iron: 73 ug/dL (ref 45–160)
TIBC: 326 mcg/dL (calc) (ref 250–450)

## 2020-08-03 LAB — RHEUMATOID FACTOR: Rhuematoid fact SerPl-aCnc: <14 IU/mL (ref <14)

## 2020-08-03 LAB — CYCLIC CITRUL PEPTIDE ANTIBODY, IGG: Cyclic Citrullin Peptide Ab: <16 UNITS

## 2020-08-03 LAB — URIC ACID: Uric Acid, Serum: 3.2 mg/dL (ref 2.5–7.0)

## 2020-08-11 NOTE — Progress Notes (Signed)
I will discuss results at the follow-up visit.

## 2020-08-14 DIAGNOSIS — M170 Bilateral primary osteoarthritis of knee: Secondary | ICD-10-CM | POA: Insufficient documentation

## 2020-08-14 DIAGNOSIS — M5136 Other intervertebral disc degeneration, lumbar region: Secondary | ICD-10-CM | POA: Insufficient documentation

## 2020-08-14 DIAGNOSIS — M79642 Pain in left hand: Secondary | ICD-10-CM | POA: Insufficient documentation

## 2020-08-14 DIAGNOSIS — M160 Bilateral primary osteoarthritis of hip: Secondary | ICD-10-CM | POA: Insufficient documentation

## 2020-08-14 DIAGNOSIS — M79641 Pain in right hand: Secondary | ICD-10-CM | POA: Insufficient documentation

## 2020-08-14 NOTE — Progress Notes (Signed)
Office Visit Note  Patient: [name]             Date of Birth: [birth year]           MRN: [id number]             PCP: Rometta Emery, MD Referring: Rometta Emery, MD Visit Date: 08/24/2020 Occupation: @GUAROCC @  Subjective:  Pain and burning in both hands..   History of Present Illness: [name] is a 87 y.o. female with history of osteoarthritis.  She states she has been experiencing pain and burning sensation in bilateral hands.  She complains of nocturnal pain.  She states her hands feel numb and she is having difficulty holding objects.  She continues to have some discomfort in her hips and knee joints.  She mobilizes with the help of a walker.  Activities of Daily Living:  Patient reports morning stiffness for all day.   Patient Reports nocturnal pain.  Difficulty dressing/grooming: Reports Difficulty climbing stairs: Reports Difficulty getting out of chair: Reports Difficulty using hands for taps, buttons, cutlery, and/or writing: Reports  Review of Systems  Constitutional: Positive for fatigue.  HENT: Negative for mouth sores, mouth dryness and nose dryness.   Eyes: Positive for dryness. Negative for pain and itching.  Respiratory: Negative for shortness of breath and difficulty breathing.   Cardiovascular: Negative for chest pain and palpitations.  Gastrointestinal: Negative for blood in stool, constipation and diarrhea.  Endocrine: Negative for increased urination.  Genitourinary: Negative for difficulty urinating.  Musculoskeletal: Positive for arthralgias, joint pain, joint swelling, myalgias, morning stiffness, muscle tenderness and myalgias.  Skin: Negative for color change, rash and redness.  Allergic/Immunologic: Negative for susceptible to infections.  Neurological: Positive for numbness. Negative for dizziness, headaches, memory loss and weakness.  Hematological: Positive for bruising/bleeding tendency.  Psychiatric/Behavioral:  Negative for confusion and sleep disturbance.    PMFS History:  Patient Active Problem List   Diagnosis Date Noted  . Pain in both hands 08/14/2020  . Primary osteoarthritis of both hips 08/14/2020  . Primary osteoarthritis of both knees 08/14/2020  . DDD (degenerative disc disease), lumbar 08/14/2020  . EAR PAIN, RIGHT 09/02/2007  . POSTMENOPAUSAL STATUS 03/28/2007  . HYPERLIPIDEMIA 05/27/2006  . ANEMIA, MICROCYTIC 05/27/2006  . HYPERTENSION 05/27/2006  . ALLERGIC RHINITIS 05/27/2006  . RECTOVAGINAL FISTULA 05/27/2006  . OSTEOARTHRITIS 05/27/2006  . HAMSTRING TENDINITIS 05/27/2006  . OSTEOPENIA 05/27/2006  . INSOMNIA 05/27/2006    Past Medical History:  Diagnosis Date  . Allergic rhinitis   . Allergic rhinitis   . Hamstring tendonitis at origin    Right hamstring  . Hyperlipidemia   . Hypertension    Well controlled.   . Insomnia   . Microcytic anemia    Baseline about 11.  MCV approx 70. Ferritin 196 in 9/08  . Osteoarthritis    Bilateral knees, x ray 6/05:  Right knee- Severe tricompartmental degenerative arthritis with loose bodies.  . Osteopenia    Hx, no longer osteopenic,  normal DEXA 11/07  . Rectovaginal fistula    Seen during colonoscopy    Family History  Problem Relation Age of Onset  . Breast cancer Mother   . Breast cancer Sister   . Cancer Brother   . Healthy Son    Past Surgical History:  Procedure Laterality Date  . CATARACT EXTRACTION, BILATERAL    . KNEE ARTHROSCOPY Bilateral    Social History   Social History Narrative  . Not on file  Immunization History  Administered Date(s) Administered  . Influenza Whole 05/11/2006, 04/16/2007, 04/06/2008  . PFIZER(Purple Top)SARS-COV-2 Vaccination 08/21/2019, 09/15/2019, 05/24/2020     Objective: Vital Signs: BP 127/79 (BP Location: Left Arm, Patient Position: Sitting, Cuff Size: Normal)   Pulse 92   Resp 16   Ht 5\' 7"  (1.702 m)   Wt 184 lb (83.5 kg)   BMI 28.82 kg/m    Physical  Exam Vitals and nursing note reviewed.  Constitutional:      Appearance: She is well-developed and well-nourished.  HENT:     Head: Normocephalic and atraumatic.  Eyes:     Extraocular Movements: EOM normal.     Conjunctiva/sclera: Conjunctivae normal.  Cardiovascular:     Rate and Rhythm: Normal rate and regular rhythm.     Pulses: Intact distal pulses.     Heart sounds: Normal heart sounds.  Pulmonary:     Effort: Pulmonary effort is normal.     Breath sounds: Normal breath sounds.  Musculoskeletal:     Cervical back: Normal range of motion.  Lymphadenopathy:     Cervical: No cervical adenopathy.  Skin:    General: Skin is warm and dry.     Capillary Refill: Capillary refill takes less than 2 seconds.  Neurological:     Mental Status: She is alert and oriented to person, place, and time.  Psychiatric:        Mood and Affect: Mood and affect normal.        Behavior: Behavior normal.      Musculoskeletal Exam: C-spine was in good range of motion.  Palpable joints with good range of motion.  Wrist joints and MCPs with good range of motion with no synovitis.  She has PIP and DIP thickening.  She has loss of thenar eminence in bilateral hands.  It was difficult to assess the Phalen's and Tinel's test.  Manual compression test was negative.  Hip joints were difficult to assess in the sitting position.  She had good range of motion of her knee joints.  There is no tenderness over ankle joints. CDAI Exam: CDAI Score: - Patient Global: -; Provider Global: - Swollen: -; Tender: - Joint Exam 08/24/2020   No joint exam has been documented for this visit   There is currently no information documented on the homunculus. Go to the Rheumatology activity and complete the homunculus joint exam.  Investigation: No additional findings.  Imaging: XR Hand 2 View Left  Result Date: 08/02/2020 Severe CMC narrowing and subluxation was noted.  PIP and DIP narrowing was noted.  No MCP,  intercarpal or radiocarpal joint space narrowing was noted. Impression: These findings are consistent with osteoarthritis of the hand.  XR Hand 2 View Right  Result Date: 08/02/2020 Severe CMC narrowing was noted.  First and third MCP narrowing was noted.  PIP and DIP severe narrowing was noted.  No intercarpal radiocarpal joint space narrowing was noted. Impression: These findings are consistent with osteoarthritis of the hand.  The MCP narrowing can be seen inflammatory arthritis.   Recent Labs: Lab Results  Component Value Date   WBC 6.9 10/06/2008   HGB 11.9 (L) 12/06/2010   PLT 279 10/06/2008   NA 140 12/06/2010   K 3.5 12/06/2010   CL 106 12/06/2010   CO2 22 10/06/2008   GLUCOSE 97 12/06/2010   BUN 21 12/06/2010   CREATININE 1.10 12/06/2010   BILITOT 0.4 10/06/2008   ALKPHOS 100 10/06/2008   AST 19 10/06/2008   ALT 16  10/06/2008   PROT 7.7 10/06/2008   ALBUMIN 4.7 10/06/2008   CALCIUM 10.3 10/06/2008   GFRAA >60 10/06/2008   August 02, 2018 iron studies normal, uric acid 3.2, RF negative, anti-CCP negative  Speciality Comments: No specialty comments available.  Procedures:  No procedures performed Allergies: Penicillins and Sulfonamide derivatives   Assessment / Plan:     Visit Diagnoses: Pain in both hands - Clinical and radiographic findings are consistent with osteoarthritis.  Right third MCP narrowing was noted on the x-rays.  History of intermittent swelling.  No synovitis was noted.  All autoimmune work-up is negative.    Paresthesia of both hands-she complains of paresthesias in her hands and decreased grip strength.  She has loss of thenar eminence bilaterally.  Phalen's and Tinel's were difficult to perform.  I will refer her to hand surgery for evaluation.  Primary osteoarthritis of both hips - Bilateral end-stage osteoarthritis was noted on the x-rays.  She has difficulty walking.  She mobilizes with the help of a walker.  Primary osteoarthritis of both  knees - Followed by orthopedics.  X-rays were not obtained.  History of arthroscopic surgery on bilateral knee joints.  DDD (degenerative disc disease), lumbar - MRI of the lumbar spine in the past showed multilevel spondylosis, facet joint arthropathy and spinal stenosis.  Osteopenia of multiple sites - He is on calcium and vitamin D.  Essential hypertension  History of hyperlipidemia  Other insomnia  Microcytic anemia  Orders: Orders Placed This Encounter  Procedures  . AMB referral to orthopedics   No orders of the defined types were placed in this encounter.    Follow-Up Instructions: Return if symptoms worsen or fail to improve, for Osteoarthritis.   Pollyann Savoy, MD  Note - This record has been created using Animal nutritionist.  Chart creation errors have been sought, but may not always  have been located. Such creation errors do not reflect on  the standard of medical care.

## 2020-08-18 ENCOUNTER — Ambulatory Visit: Payer: Self-pay | Admitting: Rheumatology

## 2020-08-24 ENCOUNTER — Encounter: Payer: Self-pay | Admitting: Rheumatology

## 2020-08-24 ENCOUNTER — Other Ambulatory Visit: Payer: Self-pay

## 2020-08-24 ENCOUNTER — Ambulatory Visit: Payer: Medicare Other | Admitting: Rheumatology

## 2020-08-24 VITALS — BP 127/79 | HR 92 | Resp 16 | Ht 67.0 in | Wt 184.0 lb

## 2020-08-24 DIAGNOSIS — M5136 Other intervertebral disc degeneration, lumbar region: Secondary | ICD-10-CM

## 2020-08-24 DIAGNOSIS — M79642 Pain in left hand: Secondary | ICD-10-CM

## 2020-08-24 DIAGNOSIS — M170 Bilateral primary osteoarthritis of knee: Secondary | ICD-10-CM | POA: Diagnosis not present

## 2020-08-24 DIAGNOSIS — M160 Bilateral primary osteoarthritis of hip: Secondary | ICD-10-CM | POA: Diagnosis not present

## 2020-08-24 DIAGNOSIS — M79641 Pain in right hand: Secondary | ICD-10-CM

## 2020-08-24 DIAGNOSIS — D509 Iron deficiency anemia, unspecified: Secondary | ICD-10-CM

## 2020-08-24 DIAGNOSIS — M8589 Other specified disorders of bone density and structure, multiple sites: Secondary | ICD-10-CM

## 2020-08-24 DIAGNOSIS — R202 Paresthesia of skin: Secondary | ICD-10-CM

## 2020-08-24 DIAGNOSIS — G4709 Other insomnia: Secondary | ICD-10-CM

## 2020-08-24 DIAGNOSIS — I10 Essential (primary) hypertension: Secondary | ICD-10-CM

## 2020-08-24 DIAGNOSIS — Z8639 Personal history of other endocrine, nutritional and metabolic disease: Secondary | ICD-10-CM

## 2020-09-07 ENCOUNTER — Ambulatory Visit: Payer: Medicare Other | Admitting: Orthopaedic Surgery

## 2020-09-15 ENCOUNTER — Ambulatory Visit: Payer: Self-pay | Admitting: Rheumatology

## 2020-09-28 ENCOUNTER — Ambulatory Visit: Payer: Medicare Other | Admitting: Orthopaedic Surgery

## 2020-09-28 ENCOUNTER — Other Ambulatory Visit: Payer: Self-pay

## 2020-09-28 DIAGNOSIS — M19041 Primary osteoarthritis, right hand: Secondary | ICD-10-CM

## 2020-09-28 DIAGNOSIS — M19042 Primary osteoarthritis, left hand: Secondary | ICD-10-CM | POA: Diagnosis not present

## 2020-09-28 MED ORDER — DICLOFENAC SODIUM 75 MG PO TBEC: 75.0000 mg | DELAYED_RELEASE_TABLET | Freq: Two times a day (BID) | ORAL | 2 refills | Status: DC | PRN

## 2020-09-28 MED ORDER — DICLOFENAC SODIUM 1 % EX GEL: 2.0000 g | Freq: Four times a day (QID) | CUTANEOUS | 0 refills | Status: DC

## 2020-09-28 NOTE — Progress Notes (Signed)
Office Visit Note   Patient: [name]           Date of Birth: [birth year]           MRN: [id number] Visit Date: 09/28/2020              Requested by: Pollyann Savoy, MD [address] [place],  [place] [address] PCP: Rometta Emery, MD   Assessment & Plan: Visit Diagnoses:  1. Primary osteoarthritis of hands, bilateral     Plan: Impression is bilateral hand arthritis.  We have discussed changing her oral anti-inflammatory as well as starting her on topical anti-inflammatories.  She agrees with this plan.  She will follow up with [place] as needed.  Follow-Up Instructions: Return if symptoms worsen or fail to improve.   Orders:  No orders of the defined types were placed in this encounter.  Meds ordered this encounter  Medications  . diclofenac (VOLTAREN) 75 MG EC tablet    Sig: Take 1 tablet (75 mg total) by mouth 2 (two) times daily as needed.    Dispense:  60 tablet    Refill:  2  . diclofenac Sodium (VOLTAREN) 1 % GEL    Sig: Apply 2 g topically 4 (four) times daily.    Dispense:  150 g    Refill:  0      Procedures: No procedures performed   Clinical Data: No additional findings.   Subjective: Chief Complaint  Patient presents with  . Right Hand - Pain  . Left Hand - Pain    HPI patient is a very pleasant 87 year old right-hand-dominant female who comes in today with bilateral hand pain right greater than left.  She has had this pain for about a year which has progressively worsened.  No known injury.  She does note that she used to be ball quite a bit.  She notes that her symptoms are worse with using her hands especially when she is walking with a walker.  She has been taking ibuprofen and tramadol with mild relief of symptoms.  Review of Systems as detailed in HPI.  All others reviewed and are negative.   Objective: Vital Signs: There were no vitals taken for this visit.  Physical Exam well-developed well-nourished female no  acute distress.  Alert oriented x3.  Ortho Exam bilateral hand exam shows mild tenderness to the DIP joints.  She is able to make a full composite fist and she is able to fully extend her fingers.  She is neurovascular intact distally.  Specialty Comments:  No specialty comments available.  Imaging: No results found.   PMFS History: Patient Active Problem List   Diagnosis Date Noted  . Pain in both hands 08/14/2020  . Primary osteoarthritis of both hips 08/14/2020  . Primary osteoarthritis of both knees 08/14/2020  . DDD (degenerative disc disease), lumbar 08/14/2020  . EAR PAIN, RIGHT 09/02/2007  . POSTMENOPAUSAL STATUS 03/28/2007  . HYPERLIPIDEMIA 05/27/2006  . ANEMIA, MICROCYTIC 05/27/2006  . HYPERTENSION 05/27/2006  . ALLERGIC RHINITIS 05/27/2006  . RECTOVAGINAL FISTULA 05/27/2006  . OSTEOARTHRITIS 05/27/2006  . HAMSTRING TENDINITIS 05/27/2006  . OSTEOPENIA 05/27/2006  . INSOMNIA 05/27/2006   Past Medical History:  Diagnosis Date  . Allergic rhinitis   . Allergic rhinitis   . Hamstring tendonitis at origin    Right hamstring  . Hyperlipidemia   . Hypertension    Well controlled.   . Insomnia   . Microcytic anemia    Baseline about 11.  MCV approx 70. Ferritin 196 in 9/08  . Osteoarthritis    Bilateral knees, x ray 6/05:  Right knee- Severe tricompartmental degenerative arthritis with loose bodies.  . Osteopenia    Hx, no longer osteopenic,  normal DEXA 11/07  . Rectovaginal fistula    Seen during colonoscopy    Family History  Problem Relation Age of Onset  . Breast cancer Mother   . Breast cancer Sister   . Cancer Brother   . Healthy Son     Past Surgical History:  Procedure Laterality Date  . CATARACT EXTRACTION, BILATERAL    . KNEE ARTHROSCOPY Bilateral    Social History   Occupational History  . Not on file  Tobacco Use  . Smoking status: Never Smoker  . Smokeless tobacco: Never Used  Vaping Use  . Vaping Use: Never used  Substance and  Sexual Activity  . Alcohol use: No  . Drug use: No  . Sexual activity: Not on file

## 2020-12-27 ENCOUNTER — Other Ambulatory Visit: Payer: Self-pay

## 2020-12-27 ENCOUNTER — Emergency Department (HOSPITAL_COMMUNITY)
Admission: EM | Admit: 2020-12-27 | Discharge: 2020-12-27 | Disposition: A | Discharge status: Home / Self Care | Payer: Medicare Other | Attending: Emergency Medicine | Admitting: Emergency Medicine

## 2020-12-27 DIAGNOSIS — I10 Essential (primary) hypertension: Secondary | ICD-10-CM | POA: Insufficient documentation

## 2020-12-27 DIAGNOSIS — Z79899 Other long term (current) drug therapy: Secondary | ICD-10-CM | POA: Insufficient documentation

## 2020-12-27 DIAGNOSIS — R030 Elevated blood-pressure reading, without diagnosis of hypertension: Secondary | ICD-10-CM | POA: Diagnosis present

## 2020-12-27 LAB — BASIC METABOLIC PANEL
Anion gap: 10 (ref 5–15)
BUN: 22 mg/dL (ref 8–23)
CO2: 16 mmol/L — ABNORMAL LOW (ref 22–32)
Calcium: 9.9 mg/dL (ref 8.9–10.3)
Chloride: 115 mmol/L — ABNORMAL HIGH (ref 98–111)
Creatinine, Ser: 0.95 mg/dL (ref 0.44–1.00)
GFR, Estimated: 59 mL/min — ABNORMAL LOW (ref >60)
Glucose, Bld: 136 mg/dL — ABNORMAL HIGH (ref 70–99)
Potassium: 3.8 mmol/L (ref 3.5–5.1)
Sodium: 141 mmol/L (ref 135–145)

## 2020-12-27 LAB — CBC WITH DIFFERENTIAL/PLATELET
Abs Immature Granulocytes: 0.05 10*3/uL (ref 0.00–0.07)
Basophils Absolute: 0.1 10*3/uL (ref 0.0–0.1)
Basophils Relative: 1 %
Eosinophils Absolute: 0.2 10*3/uL (ref 0.0–0.5)
Eosinophils Relative: 4 %
HCT: 34.7 % — ABNORMAL LOW (ref 36.0–46.0)
Hemoglobin: 10.6 g/dL — ABNORMAL LOW (ref 12.0–15.0)
Immature Granulocytes: 1 %
Lymphocytes Relative: 28 %
Lymphs Abs: 1.7 10*3/uL (ref 0.7–4.0)
MCH: 22.4 pg — ABNORMAL LOW (ref 26.0–34.0)
MCHC: 30.5 g/dL (ref 30.0–36.0)
MCV: 73.4 fL — ABNORMAL LOW (ref 80.0–100.0)
Monocytes Absolute: 0.4 10*3/uL (ref 0.1–1.0)
Monocytes Relative: 7 %
Neutro Abs: 3.6 10*3/uL (ref 1.7–7.7)
Neutrophils Relative %: 59 %
Platelets: 195 10*3/uL (ref 150–400)
RBC: 4.73 MIL/uL (ref 3.87–5.11)
RDW: 15.3 % (ref 11.5–15.5)
WBC: 6 10*3/uL (ref 4.0–10.5)
nRBC: 0 % (ref 0.0–0.2)

## 2020-12-27 MED ORDER — CLONIDINE HCL 0.1 MG PO TABS
0.1000 mg | ORAL_TABLET | Freq: Once | ORAL | Status: AC
  Administered 2020-12-27: 0.1 mg via ORAL
  Filled 2020-12-27: qty 1

## 2020-12-27 NOTE — Discharge Instructions (Addendum)
Call Dr. Mikeal Hawthorne tomorrow to schedule a follow-up appointment.  He needs to change around your blood pressure medications.  He also needs to repeat your electrolytes as your CO2 was low

## 2020-12-27 NOTE — ED Triage Notes (Signed)
Pt presents for eval of hypertension-systolic 214 on home cuff today. Hx of hypertension, has been compliant with medications. Denies chest pain or headache. Endorses ringing in her ear.

## 2020-12-27 NOTE — ED Provider Notes (Signed)
[facility] EMERGENCY DEPARTMENT Provider Note   CSN: [id number] Arrival date & time: 12/27/20  1337     History Chief Complaint  Patient presents with   Hypertension    GAILYA TAUER is a 87 y.o. female.  87 year old female presents for evaluation of high blood pressure at home.  States that her systolic blood pressure was 214.  Denies any associated symptoms such as headache, chest pain, shortness of breath.  Does note some tinnitus.  No recent changes to any of her medications.  Otherwise feels at her baseline.  Denies any focal weakness      Past Medical History:  Diagnosis Date   Allergic rhinitis    Allergic rhinitis    Hamstring tendonitis at origin    Right hamstring   Hyperlipidemia    Hypertension    Well controlled.    Insomnia    Microcytic anemia    Baseline about 11.  MCV approx 70. Ferritin 196 in 9/08   Osteoarthritis    Bilateral knees, x ray 6/05:  Right knee- Severe tricompartmental degenerative arthritis with loose bodies.   Osteopenia    Hx, no longer osteopenic,  normal DEXA 11/07   Rectovaginal fistula    Seen during colonoscopy    Patient Active Problem List   Diagnosis Date Noted   Pain in both hands 08/14/2020   Primary osteoarthritis of both hips 08/14/2020   Primary osteoarthritis of both knees 08/14/2020   DDD (degenerative disc disease), lumbar 08/14/2020   EAR PAIN, RIGHT 09/02/2007   POSTMENOPAUSAL STATUS 03/28/2007   HYPERLIPIDEMIA 05/27/2006   ANEMIA, MICROCYTIC 05/27/2006   HYPERTENSION 05/27/2006   ALLERGIC RHINITIS 05/27/2006   RECTOVAGINAL FISTULA 05/27/2006   OSTEOARTHRITIS 05/27/2006   HAMSTRING TENDINITIS 05/27/2006   OSTEOPENIA 05/27/2006   INSOMNIA 05/27/2006    Past Surgical History:  Procedure Laterality Date   CATARACT EXTRACTION, BILATERAL     KNEE ARTHROSCOPY Bilateral      OB History   No obstetric history on file.     Family History  Problem Relation Age of Onset    Breast cancer Mother    Breast cancer Sister    Cancer Brother    Healthy Son     Social History   Tobacco Use   Smoking status: Never   Smokeless tobacco: Never  Vaping Use   Vaping Use: Never used  Substance Use Topics   Alcohol use: No   Drug use: No    Home Medications Prior to Admission medications   Medication Sig Start Date End Date Taking? Authorizing Provider  diclofenac (VOLTAREN) 75 MG EC tablet Take 1 tablet (75 mg total) by mouth 2 (two) times daily as needed. 09/28/20   Cristie Hem, PA-C  diclofenac Sodium (VOLTAREN) 1 % GEL Apply 2 g topically 4 (four) times daily. 09/28/20   Cristie Hem, PA-C  furosemide (LASIX) 40 MG tablet Take 40 mg by mouth daily. 07/27/20   [provider]  ibuprofen (ADVIL) 800 MG tablet Take 800 mg by mouth 3 (three) times daily. 08/22/20   [provider]  loratadine (CLARITIN) 10 MG tablet Take 10 mg by mouth daily.    [provider]  losartan (COZAAR) 100 MG tablet Take 100 mg by mouth daily. 07/27/20   [provider]  polyethylene glycol (MIRALAX / GLYCOLAX) packet Take 17 g by mouth daily.    [provider]  simvastatin (ZOCOR) 20 MG tablet Take 20 mg by mouth at bedtime. 07/27/20  [provider]  traMADol (ULTRAM) 50 MG tablet Take 50 mg by mouth 4 (four) times daily. 05/10/20   [provider]    Allergies    Penicillins and Sulfonamide derivatives  Review of Systems   Review of Systems  All other systems reviewed and are negative.  Physical Exam Updated Vital Signs BP (!) 160/76   Pulse 68   Temp 98.4 F (36.9 C) (Oral)   Resp 16   SpO2 100%   Physical Exam Vitals and nursing note reviewed.  Constitutional:      General: She is not in acute distress.    Appearance: Normal appearance. She is well-developed. She is not toxic-appearing.  HENT:     Head: Normocephalic and atraumatic.  Eyes:     General: Lids are normal.     Conjunctiva/sclera:  Conjunctivae normal.     Pupils: Pupils are equal, round, and reactive to light.  Neck:     Thyroid: No thyroid mass.     Trachea: No tracheal deviation.  Cardiovascular:     Rate and Rhythm: Normal rate and regular rhythm.     Heart sounds: Normal heart sounds. No murmur heard.   No gallop.  Pulmonary:     Effort: Pulmonary effort is normal. No respiratory distress.     Breath sounds: Normal breath sounds. No stridor. No decreased breath sounds, wheezing, rhonchi or rales.  Abdominal:     General: There is no distension.     Palpations: Abdomen is soft.     Tenderness: There is no abdominal tenderness. There is no rebound.  Musculoskeletal:        General: No tenderness. Normal range of motion.     Cervical back: Normal range of motion and neck supple.  Skin:    General: Skin is warm and dry.     Findings: No abrasion or rash.  Neurological:     Mental Status: She is alert and oriented to person, place, and time. Mental status is at baseline.     GCS: GCS eye subscore is 4. GCS verbal subscore is 5. GCS motor subscore is 6.     Cranial Nerves: Cranial nerves are intact. No cranial nerve deficit.     Sensory: No sensory deficit.     Motor: Motor function is intact.  Psychiatric:        Attention and Perception: Attention normal.        Speech: Speech normal.        Behavior: Behavior normal.    ED Results / Procedures / Treatments   Labs (all labs ordered are listed, but only abnormal results are displayed) Labs Reviewed  CBC WITH DIFFERENTIAL/PLATELET - Abnormal; Notable for the following components:      Result Value   Hemoglobin 10.6 (*)    HCT 34.7 (*)    MCV 73.4 (*)    MCH 22.4 (*)    All other components within normal limits  BASIC METABOLIC PANEL - Abnormal; Notable for the following components:   Chloride 115 (*)    CO2 16 (*)    Glucose, Bld 136 (*)    GFR, Estimated 59 (*)    All other components within normal limits  URINALYSIS, ROUTINE W REFLEX  MICROSCOPIC    EKG EKG Interpretation  Date/Time:  Tuesday December 27 2020 14:09:19 EDT Ventricular Rate:  89 PR Interval:  208 QRS Duration: 70 QT Interval:  360 QTC Calculation: 438 R Axis:   38 Text Interpretation: Normal sinus rhythm Nonspecific  ST abnormality Abnormal ECG Confirmed by Lorre Nick ([id number]) on 12/27/2020 5:58:34 PM  Radiology No results found.  Procedures Procedures   Medications Ordered in ED Medications  cloNIDine (CATAPRES) tablet 0.1 mg (has no administration in time range)    ED Course  I have reviewed the triage vital signs and the nursing notes.  Pertinent labs & imaging results that were available during my care of the patient were reviewed by me and considered in my medical decision making (see chart for details).    MDM Rules/Calculators/A&P                          Patient given clonidine here and blood pressure stable.  Will discharge home and have her follow-up with her doctor for her blood pressure as well as for her low bicarb Final Clinical Impression(s) / ED Diagnoses Final diagnoses:  None    Rx / DC Orders ED Discharge Orders     None        Lorre Nick, MD 12/27/20 1950

## 2020-12-27 NOTE — ED Provider Notes (Signed)
Emergency Medicine Provider Triage Evaluation Note  [name] , a 87 y.o. female  was evaluated in triage.  Pt complains of high blood pressure at home with systolic of 214, denies headache but endorses some ringing in the ear intermittently.  Denies any chest pain shortness of breath palpitations but does endorse lower extremity swelling.  Denies any recent in her urine but does endorse increasing her urine output though she is on diuretics..  Review of Systems  Positive: Blood pressure, lower extremity edema Negative: Chest pain, shortness of breath palpitations, nausea, vomiting, diarrhea, fevers, chills.  Physical Exam  BP (!) 148/68 (BP Location: Left Arm)   Pulse (!) 101   Temp 98.4 F (36.9 C) (Oral)   Resp 16   SpO2 100%  Gen:   Awake, no distress   Resp:  Normal effort  MSK:   Moves extremities without difficulty  Other:  Mildly tachycardic with her rate of 101, lungs CTA B, RRR, no M/R/G.  Medical Decision Making  Medically screening exam initiated at 2:16 PM.  Appropriate orders placed.  [name] was informed that the remainder of the evaluation will be completed by another provider, this initial triage assessment does not replace that evaluation, and the importance of remaining in the ED until their evaluation is complete.  This chart was dictated using voice recognition software, Dragon. Despite the best efforts of this provider to proofread and correct errors, errors may still occur which can change documentation meaning.    Sherrilee Gilles 12/27/20 1417    Eber Hong, MD 12/28/20 1031

## 2021-01-30 ENCOUNTER — Ambulatory Visit: Payer: Medicare Other | Attending: Internal Medicine

## 2021-01-30 DIAGNOSIS — Z23 Encounter for immunization: Secondary | ICD-10-CM

## 2021-01-30 NOTE — Progress Notes (Signed)
   Covid-19 Vaccination Clinic  Name:  [name]    MRN: [id number] DOB: December 11, 1933  01/30/2021  [name] was observed post Covid-19 immunization for 15 minutes without incident. She was provided with Vaccine Information Sheet and instruction to access the V-Safe system.   [name] was instructed to call 911 with any severe reactions post vaccine: Difficulty breathing  Swelling of face and throat  A fast heartbeat  A bad rash all over body  Dizziness and weakness   Immunizations Administered     Name Date Dose VIS Date Route   PFIZER Comrnaty(Gray TOP) Covid-19 Vaccine 01/30/2021  1:05 PM 0.3 mL 06/16/2020 Intramuscular   Manufacturer: [employer], [employer]   Lot: [id number]   NDC: [phone]

## 2021-02-01 ENCOUNTER — Other Ambulatory Visit: Payer: Self-pay | Admitting: Physician Assistant

## 2021-02-03 ENCOUNTER — Other Ambulatory Visit (HOSPITAL_BASED_OUTPATIENT_CLINIC_OR_DEPARTMENT_OTHER): Payer: Self-pay

## 2021-02-03 MED ORDER — COVID-19 MRNA VAC-TRIS(PFIZER) 30 MCG/0.3ML IM SUSP
INTRAMUSCULAR | 0 refills | Status: DC
  Filled 2021-02-03: qty 0.3, 1d supply, fill #0

## 2021-02-15 ENCOUNTER — Other Ambulatory Visit: Payer: Self-pay | Admitting: Physician Assistant

## 2021-02-15 ENCOUNTER — Telehealth: Payer: Self-pay | Admitting: Orthopaedic Surgery

## 2021-02-15 MED ORDER — DICLOFENAC SODIUM 75 MG PO TBEC: 75.0000 mg | DELAYED_RELEASE_TABLET | Freq: Two times a day (BID) | ORAL | 2 refills | Status: DC | PRN

## 2021-02-15 MED ORDER — DICLOFENAC SODIUM 1 % EX GEL: 2.0000 g | Freq: Four times a day (QID) | CUTANEOUS | 0 refills | Status: AC

## 2021-02-15 NOTE — Telephone Encounter (Signed)
Pt called requesting a refill of diclofenac sodium. Please send to pharmacy on file. Please call pt when medication is called in. Pt phone number is [phone].

## 2021-02-15 NOTE — Telephone Encounter (Signed)
sent 

## 2021-06-08 ENCOUNTER — Other Ambulatory Visit: Payer: Self-pay | Admitting: Physician Assistant

## 2021-06-13 ENCOUNTER — Telehealth: Payer: Self-pay | Admitting: Orthopaedic Surgery

## 2021-06-13 ENCOUNTER — Other Ambulatory Visit: Payer: Self-pay | Admitting: Physician Assistant

## 2021-06-13 MED ORDER — DICLOFENAC SODIUM 75 MG PO TBEC: 75.0000 mg | DELAYED_RELEASE_TABLET | Freq: Two times a day (BID) | ORAL | 2 refills | Status: DC | PRN

## 2021-06-13 NOTE — Telephone Encounter (Signed)
Pt calling to get a refill on her medication of Voltaren. The best pharmacy is the one on file and the best call back number if needed is [phone]

## 2021-06-13 NOTE — Telephone Encounter (Signed)
Sent in

## 2021-11-07 ENCOUNTER — Other Ambulatory Visit: Payer: Self-pay | Admitting: Physician Assistant

## 2021-11-14 ENCOUNTER — Other Ambulatory Visit: Payer: Self-pay | Admitting: Physician Assistant

## 2021-11-17 ENCOUNTER — Telehealth: Payer: Self-pay

## 2021-11-17 ENCOUNTER — Other Ambulatory Visit: Payer: Self-pay

## 2021-11-17 MED ORDER — DICLOFENAC SODIUM 75 MG PO TBEC: 75.0000 mg | DELAYED_RELEASE_TABLET | Freq: Two times a day (BID) | ORAL | 2 refills | Status: DC | PRN

## 2021-11-17 NOTE — Telephone Encounter (Signed)
sent 

## 2021-11-17 NOTE — Telephone Encounter (Signed)
Patient would like a Rx refill on Diclofenac sent to her pharmacy.  Cb# [phone].  Please advise.  Thank you. ?

## 2022-01-26 ENCOUNTER — Ambulatory Visit: Payer: Medicare (Managed Care) | Admitting: Orthopaedic Surgery

## 2022-01-26 ENCOUNTER — Encounter: Payer: Self-pay | Admitting: Orthopaedic Surgery

## 2022-01-26 DIAGNOSIS — M19042 Primary osteoarthritis, left hand: Secondary | ICD-10-CM

## 2022-01-26 DIAGNOSIS — M19041 Primary osteoarthritis, right hand: Secondary | ICD-10-CM

## 2022-01-26 NOTE — Progress Notes (Signed)
Office Visit Note   Patient: [name]           Date of Birth: [birth year]           MRN: [id number] Visit Date: 01/26/2022              Requested by: Rometta Emery, MD [address] [place],  [place] [address] PCP: Rometta Emery, MD   Assessment & Plan: Visit Diagnoses:  1. Primary osteoarthritis of hands, bilateral     Plan: Impression is bilateral hand carpal tunnel syndrome.  She does have underlying osteoarthritis based on her complaints I feel that she is complaining about carpal tunnel syndrome therefore we will order nerve conduction studies.  Follow-up after the studies.  Follow-Up Instructions: No follow-ups on file.   Orders:  Orders Placed This Encounter  Procedures   Ambulatory referral to Physical Medicine Rehab   No orders of the defined types were placed in this encounter.     Procedures: No procedures performed   Clinical Data: No additional findings.   Subjective: Chief Complaint  Patient presents with   Right Hand - Pain   Left Hand - Pain    HPI [name] is a 88 year old female here for bilateral hand pain.  Denies any injuries.  Endorses numbness and tingling and night pain.  Feels like the hands fall asleep.  Review of Systems  Constitutional: Negative.   HENT: Negative.    Eyes: Negative.   Respiratory: Negative.    Cardiovascular: Negative.   Endocrine: Negative.   Musculoskeletal: Negative.   Neurological: Negative.   Hematological: Negative.   Psychiatric/Behavioral: Negative.    All other systems reviewed and are negative.    Objective: Vital Signs: There were no vitals taken for this visit.  Physical Exam Vitals and nursing note reviewed.  Constitutional:      Appearance: She is well-developed.  Pulmonary:     Effort: Pulmonary effort is normal.  Skin:    General: Skin is warm.     Capillary Refill: Capillary refill takes less than 2 seconds.  Neurological:     Mental Status: She is  alert and oriented to person, place, and time.  Psychiatric:        Behavior: Behavior normal.        Thought Content: Thought content normal.        Judgment: Judgment normal.     Ortho Exam Examination bilateral hands show enlarged IP joints due to the underlying osteophytes.  Positive carpal tunnel compressive signs.  Normal capillary refill. Specialty Comments:  No specialty comments available.  Imaging: No results found.   PMFS History: Patient Active Problem List   Diagnosis Date Noted   Pain in both hands 08/14/2020   Primary osteoarthritis of both hips 08/14/2020   Primary osteoarthritis of both knees 08/14/2020   DDD (degenerative disc disease), lumbar 08/14/2020   EAR PAIN, RIGHT 09/02/2007   POSTMENOPAUSAL STATUS 03/28/2007   HYPERLIPIDEMIA 05/27/2006   ANEMIA, MICROCYTIC 05/27/2006   HYPERTENSION 05/27/2006   ALLERGIC RHINITIS 05/27/2006   RECTOVAGINAL FISTULA 05/27/2006   OSTEOARTHRITIS 05/27/2006   HAMSTRING TENDINITIS 05/27/2006   OSTEOPENIA 05/27/2006   INSOMNIA 05/27/2006   Past Medical History:  Diagnosis Date   Allergic rhinitis    Allergic rhinitis    Hamstring tendonitis at origin    Right hamstring   Hyperlipidemia    Hypertension    Well controlled.    Insomnia    Microcytic anemia  Baseline about 11.  MCV approx 70. Ferritin 196 in 9/08   Osteoarthritis    Bilateral knees, x ray 6/05:  Right knee- Severe tricompartmental degenerative arthritis with loose bodies.   Osteopenia    Hx, no longer osteopenic,  normal DEXA 11/07   Rectovaginal fistula    Seen during colonoscopy    Family History  Problem Relation Age of Onset   Breast cancer Mother    Breast cancer Sister    Cancer Brother    Healthy Son     Past Surgical History:  Procedure Laterality Date   CATARACT EXTRACTION, BILATERAL     KNEE ARTHROSCOPY Bilateral    Social History   Occupational History   Not on file  Tobacco Use   Smoking status: Never   Smokeless  tobacco: Never  Vaping Use   Vaping Use: Never used  Substance and Sexual Activity   Alcohol use: No   Drug use: No   Sexual activity: Not on file

## 2022-02-21 ENCOUNTER — Encounter: Payer: Self-pay | Admitting: Physical Medicine and Rehabilitation

## 2022-02-21 ENCOUNTER — Ambulatory Visit (INDEPENDENT_AMBULATORY_CARE_PROVIDER_SITE_OTHER): Payer: Medicare (Managed Care) | Admitting: Physical Medicine and Rehabilitation

## 2022-02-21 DIAGNOSIS — R202 Paresthesia of skin: Secondary | ICD-10-CM

## 2022-02-21 NOTE — Progress Notes (Signed)
Pt state pain and numbness in both hands and all fingers, mostly her right side. Pt state she has been dropping items. Pt state she takes pain meds and uses a brace to help ease his pain. Pt state she right handed.  Numeric Pain Rating Scale and Functional Assessment Average Pain 8   In the last MONTH (on 0-10 scale) has pain interfered with the following?  1. General activity like being  able to carry out your everyday physical activities such as walking, climbing stairs, carrying groceries, or moving a chair?  Rating(10)    -BT,

## 2022-02-22 NOTE — Procedures (Signed)
EMG & NCV Findings: Evaluation of the left median motor and the right median motor nerves showed prolonged distal onset latency (L11.6, R18.4 ms), reduced amplitude (L0.2, R0.0 mV), and decreased conduction velocity (Elbow-Wrist, L45, R35 m/s).  The left median (across palm) sensory nerve showed no response (Palm), prolonged distal peak latency (8.0 ms), and reduced amplitude (3.5 V).  The right median (across palm) sensory nerve showed no response (Wrist) and no response (Palm).  All remaining nerves (as indicated in the following tables) were within normal limits.  Left vs. Right side comparison data for the median motor nerve indicates abnormal L-R latency difference (6.8 ms), abnormal L-R amplitude difference (100.0 %), and abnormal L-R velocity difference (Elbow-Wrist, 10 m/s).  All remaining left vs. right side differences were within normal limits.    Needle evaluation of the right abductor pollicis brevis muscle showed decreased insertional activity, widespread spontaneous activity, and diminished recruitment.  The left abductor pollicis brevis muscle showed increased insertional activity, widespread spontaneous activity, and diminished recruitment.  All remaining muscles (as indicated in the following table) showed no evidence of electrical instability.    Impression: The above electrodiagnostic study is ABNORMAL and reveals evidence of a very severe bilateral R>L median nerve entrapment at the wrist (carpal tunnel syndrome) affecting sensory and motor components. The lesion is characterized by sensory and motor demyelination with evidence of significant axonal injury.  Despite decompression therapy there will likely be residual symptoms.  There is no significant electrodiagnostic evidence of any other focal nerve entrapment, brachial plexopathy or cervical radiculopathy.   Recommendations: 1.  Follow-up with referring physician. 2.  Continue current management of symptoms. 3.  Suggest  surgical evaluation.  ___________________________ Naaman Plummer FAAPMR Board Certified, American Board of Physical Medicine and Rehabilitation    Nerve Conduction Studies Anti Sensory Summary Table   Stim Site NR Peak (ms) Norm Peak (ms) P-T Amp (V) Norm P-T Amp Site1 Site2 Delta-P (ms) Dist (cm) Vel (m/s) Norm Vel (m/s)  Left Median Acr Palm Anti Sensory (2nd Digit)  30.6C  Wrist    *8.0 <3.6 *3.5 >10 Wrist Palm  0.0    Palm *NR  <2.0          Right Median Acr Palm Anti Sensory (2nd Digit)  31.1C  Wrist *NR  <3.6  >10 Wrist Palm  0.0    Palm *NR  <2.0          Left Radial Anti Sensory (Base 1st Digit)  29.7C  Wrist    2.2 <3.1 24.6  Wrist Base 1st Digit 2.2 0.0    Right Radial Anti Sensory (Base 1st Digit)  31.2C  Wrist    2.3 <3.1 7.8  Wrist Base 1st Digit 2.3 0.0    Left Ulnar Anti Sensory (5th Digit)  31C  Wrist    3.7 <3.7 18.9 >15.0 Wrist 5th Digit 3.7 14.0 38 >38  Right Ulnar Anti Sensory (5th Digit)  31.5C  Wrist    3.5 <3.7 17.8 >15.0 Wrist 5th Digit 3.5 14.0 40 >38   Motor Summary Table   Stim Site NR Onset (ms) Norm Onset (ms) O-P Amp (mV) Norm O-P Amp Site1 Site2 Delta-0 (ms) Dist (cm) Vel (m/s) Norm Vel (m/s)  Left Median Motor (Abd Poll Brev)  30.6C  Wrist    *11.6 <4.2 *0.2 >5 Elbow Wrist 5.4 24.5 *45 >50  Elbow    17.0  0.2         Right Median Motor (Abd Poll Brev)  31.3C  Wrist    *18.4 <4.2 *0.0 >5 Elbow Wrist 6.6 23.0 *35 >50  Elbow    25.0  0.0         Left Ulnar Motor (Abd Dig Min)  31.2C  Wrist    3.3 <4.2 8.2 >3 B Elbow Wrist 3.7 19.5 53 >53  B Elbow    7.0  6.5  A Elbow B Elbow 1.4 10.0 71 >53  A Elbow    8.4  6.8         Right Ulnar Motor (Abd Dig Min)  31.5C  Wrist    3.3 <4.2 6.9 >3 B Elbow Wrist 3.8 21.0 55 >53  B Elbow    7.1  4.8  A Elbow B Elbow 1.7 10.0 59 >53  A Elbow    8.8  5.9          EMG   Side Muscle Nerve Root Ins Act Fibs Psw Amp Dur Poly Recrt Int Dennie Bible Comment  Right Abd Poll Brev Median C8-T1 *Decr *4+ *4+ Nml Nml 0  *Reduced Nml no muap  Right 1stDorInt Ulnar C8-T1 Nml Nml Nml Nml Nml 0 Nml Nml   Right PronatorTeres Median C6-7 Nml Nml Nml Nml Nml 0 Nml Nml   Right Biceps Musculocut C5-6 Nml Nml Nml Nml Nml 0 Nml Nml   Right Deltoid Axillary C5-6 Nml Nml Nml Nml Nml 0 Nml Nml   Left Abd Poll Brev Median C8-T1 *Incr *4+ *4+ Nml Nml 0 *Reduced Nml few muap    Nerve Conduction Studies Anti Sensory Left/Right Comparison   Stim Site L Lat (ms) R Lat (ms) L-R Lat (ms) L Amp (V) R Amp (V) L-R Amp (%) Site1 Site2 L Vel (m/s) R Vel (m/s) L-R Vel (m/s)  Median Acr Palm Anti Sensory (2nd Digit)  30.6C  Wrist *8.0   *3.5   Wrist Palm     Palm             Radial Anti Sensory (Base 1st Digit)  29.7C  Wrist 2.2 2.3 0.1 24.6 7.8 68.3 Wrist Base 1st Digit     Ulnar Anti Sensory (5th Digit)  31C  Wrist 3.7 3.5 0.2 18.9 17.8 5.8 Wrist 5th Digit 38 40 2   Motor Left/Right Comparison   Stim Site L Lat (ms) R Lat (ms) L-R Lat (ms) L Amp (mV) R Amp (mV) L-R Amp (%) Site1 Site2 L Vel (m/s) R Vel (m/s) L-R Vel (m/s)  Median Motor (Abd Poll Brev)  30.6C  Wrist *11.6 *18.4 *6.8 *0.2 *0.0 *100.0 Elbow Wrist *45 *35 *10  Elbow 17.0 25.0 8.0 0.2 0.0 100.0       Ulnar Motor (Abd Dig Min)  31.2C  Wrist 3.3 3.3 0.0 8.2 6.9 15.9 B Elbow Wrist 53 55 2  B Elbow 7.0 7.1 0.1 6.5 4.8 26.2 A Elbow B Elbow 71 59 12  A Elbow 8.4 8.8 0.4 6.8 5.9 13.2          Waveforms:

## 2022-02-23 NOTE — Progress Notes (Signed)
[name] - 88 y.o. female MRN [id number]  Date of birth: Mar 10, 1934  Office Visit Note: Visit Date: 02/21/2022 PCP: Rometta Emery, MD Referred by: Tarry Kos, MD  Subjective: Chief Complaint  Patient presents with   Right Hand - Numbness, Pain   Left Hand - Numbness, Pain   HPI:  [name] is a 88 y.o. female who comes in today at the request of Dr. Glee Arvin for electrodiagnostic study of the Bilateral upper extremities.  Patient is Right hand dominant.  Chronic worsening severe constant pain numbness and tingling in both hands right more than the left.  She reports whole hand symptoms but really can define a little bit more radial digits.  She reports that she has difficulty manipulating small objects and is dropping objects with weakness.  She reports his symptoms been going on for quite some time.  She denies any frank radicular symptoms.  No prior electrodiagnostic studies.   ROS Otherwise per HPI.  Assessment & Plan: Visit Diagnoses:    ICD-10-CM   1. Paresthesia of skin  R20.2 NCV with EMG (electromyography)      Plan: Impression: The above electrodiagnostic study is ABNORMAL and reveals evidence of a very severe bilateral R>L median nerve entrapment at the wrist (carpal tunnel syndrome) affecting sensory and motor components. The lesion is characterized by sensory and motor demyelination with evidence of significant axonal injury.  Despite decompression therapy there will likely be residual symptoms.  There is no significant electrodiagnostic evidence of any other focal nerve entrapment, brachial plexopathy or cervical radiculopathy.   Recommendations: 1.  Follow-up with referring physician. 2.  Continue current management of symptoms. 3.  Suggest surgical evaluation.  Meds & Orders: No orders of the defined types were placed in this encounter.   Orders Placed This Encounter  Procedures   NCV with EMG (electromyography)    Follow-up:  Return in about 2 weeks (around 03/07/2022) for  Glee Arvin, MD.   Procedures: No procedures performed  EMG & NCV Findings: Evaluation of the left median motor and the right median motor nerves showed prolonged distal onset latency (L11.6, R18.4 ms), reduced amplitude (L0.2, R0.0 mV), and decreased conduction velocity (Elbow-Wrist, L45, R35 m/s).  The left median (across palm) sensory nerve showed no response (Palm), prolonged distal peak latency (8.0 ms), and reduced amplitude (3.5 V).  The right median (across palm) sensory nerve showed no response (Wrist) and no response (Palm).  All remaining nerves (as indicated in the following tables) were within normal limits.  Left vs. Right side comparison data for the median motor nerve indicates abnormal L-R latency difference (6.8 ms), abnormal L-R amplitude difference (100.0 %), and abnormal L-R velocity difference (Elbow-Wrist, 10 m/s).  All remaining left vs. right side differences were within normal limits.    Needle evaluation of the right abductor pollicis brevis muscle showed decreased insertional activity, widespread spontaneous activity, and diminished recruitment.  The left abductor pollicis brevis muscle showed increased insertional activity, widespread spontaneous activity, and diminished recruitment.  All remaining muscles (as indicated in the following table) showed no evidence of electrical instability.    Impression: The above electrodiagnostic study is ABNORMAL and reveals evidence of a very severe bilateral R>L median nerve entrapment at the wrist (carpal tunnel syndrome) affecting sensory and motor components. The lesion is characterized by sensory and motor demyelination with evidence of significant axonal injury.  Despite decompression therapy there will likely be residual symptoms.  There is no significant electrodiagnostic  evidence of any other focal nerve entrapment, brachial plexopathy or cervical radiculopathy.    Recommendations: 1.  Follow-up with referring physician. 2.  Continue current management of symptoms. 3.  Suggest surgical evaluation.  ___________________________ Naaman Plummer FAAPMR Board Certified, American Board of Physical Medicine and Rehabilitation    Nerve Conduction Studies Anti Sensory Summary Table   Stim Site NR Peak (ms) Norm Peak (ms) P-T Amp (V) Norm P-T Amp Site1 Site2 Delta-P (ms) Dist (cm) Vel (m/s) Norm Vel (m/s)  Left Median Acr Palm Anti Sensory (2nd Digit)  30.6C  Wrist    *8.0 <3.6 *3.5 >10 Wrist Palm  0.0    Palm *NR  <2.0          Right Median Acr Palm Anti Sensory (2nd Digit)  31.1C  Wrist *NR  <3.6  >10 Wrist Palm  0.0    Palm *NR  <2.0          Left Radial Anti Sensory (Base 1st Digit)  29.7C  Wrist    2.2 <3.1 24.6  Wrist Base 1st Digit 2.2 0.0    Right Radial Anti Sensory (Base 1st Digit)  31.2C  Wrist    2.3 <3.1 7.8  Wrist Base 1st Digit 2.3 0.0    Left Ulnar Anti Sensory (5th Digit)  31C  Wrist    3.7 <3.7 18.9 >15.0 Wrist 5th Digit 3.7 14.0 38 >38  Right Ulnar Anti Sensory (5th Digit)  31.5C  Wrist    3.5 <3.7 17.8 >15.0 Wrist 5th Digit 3.5 14.0 40 >38   Motor Summary Table   Stim Site NR Onset (ms) Norm Onset (ms) O-P Amp (mV) Norm O-P Amp Site1 Site2 Delta-0 (ms) Dist (cm) Vel (m/s) Norm Vel (m/s)  Left Median Motor (Abd Poll Brev)  30.6C  Wrist    *11.6 <4.2 *0.2 >5 Elbow Wrist 5.4 24.5 *45 >50  Elbow    17.0  0.2         Right Median Motor (Abd Poll Brev)  31.3C  Wrist    *18.4 <4.2 *0.0 >5 Elbow Wrist 6.6 23.0 *35 >50  Elbow    25.0  0.0         Left Ulnar Motor (Abd Dig Min)  31.2C  Wrist    3.3 <4.2 8.2 >3 B Elbow Wrist 3.7 19.5 53 >53  B Elbow    7.0  6.5  A Elbow B Elbow 1.4 10.0 71 >53  A Elbow    8.4  6.8         Right Ulnar Motor (Abd Dig Min)  31.5C  Wrist    3.3 <4.2 6.9 >3 B Elbow Wrist 3.8 21.0 55 >53  B Elbow    7.1  4.8  A Elbow B Elbow 1.7 10.0 59 >53  A Elbow    8.8  5.9          EMG   Side Muscle  Nerve Root Ins Act Fibs Psw Amp Dur Poly Recrt Int Dennie Bible Comment  Right Abd Poll Brev Median C8-T1 *Decr *4+ *4+ Nml Nml 0 *Reduced Nml no muap  Right 1stDorInt Ulnar C8-T1 Nml Nml Nml Nml Nml 0 Nml Nml   Right PronatorTeres Median C6-7 Nml Nml Nml Nml Nml 0 Nml Nml   Right Biceps Musculocut C5-6 Nml Nml Nml Nml Nml 0 Nml Nml   Right Deltoid Axillary C5-6 Nml Nml Nml Nml Nml 0 Nml Nml   Left Abd Poll Brev Median C8-T1 *Incr *4+ *4+ Nml Nml 0 *  Reduced Nml few muap    Nerve Conduction Studies Anti Sensory Left/Right Comparison   Stim Site L Lat (ms) R Lat (ms) L-R Lat (ms) L Amp (V) R Amp (V) L-R Amp (%) Site1 Site2 L Vel (m/s) R Vel (m/s) L-R Vel (m/s)  Median Acr Palm Anti Sensory (2nd Digit)  30.6C  Wrist *8.0   *3.5   Wrist Palm     Palm             Radial Anti Sensory (Base 1st Digit)  29.7C  Wrist 2.2 2.3 0.1 24.6 7.8 68.3 Wrist Base 1st Digit     Ulnar Anti Sensory (5th Digit)  31C  Wrist 3.7 3.5 0.2 18.9 17.8 5.8 Wrist 5th Digit 38 40 2   Motor Left/Right Comparison   Stim Site L Lat (ms) R Lat (ms) L-R Lat (ms) L Amp (mV) R Amp (mV) L-R Amp (%) Site1 Site2 L Vel (m/s) R Vel (m/s) L-R Vel (m/s)  Median Motor (Abd Poll Brev)  30.6C  Wrist *11.6 *18.4 *6.8 *0.2 *0.0 *100.0 Elbow Wrist *45 *35 *10  Elbow 17.0 25.0 8.0 0.2 0.0 100.0       Ulnar Motor (Abd Dig Min)  31.2C  Wrist 3.3 3.3 0.0 8.2 6.9 15.9 B Elbow Wrist 53 55 2  B Elbow 7.0 7.1 0.1 6.5 4.8 26.2 A Elbow B Elbow 71 59 12  A Elbow 8.4 8.8 0.4 6.8 5.9 13.2          Waveforms:                      Clinical History: No specialty comments available.     Objective:  VS:  HT:    WT:   BMI:     BP:   HR: bpm  TEMP: ( )  RESP:  Physical Exam Musculoskeletal:        General: No swelling, tenderness or deformity.     Comments: Inspection reveals atrophy of the right more than the left APB but no atrophy of the bilateral  FDI or hand intrinsics.  There is some generalized atrophy of the muscles of  both hands.  There is no swelling, color changes, allodynia or dystrophic changes. There is 5 out of 5 strength in the bilateral wrist extension, finger abduction and long finger flexion.  She reports similar sensation to light touch in all dermatomal and peripheral nerve patterns. There is a negative Hoffmann's test bilaterally.  Skin:    General: Skin is warm and dry.     Findings: No erythema or rash.  Neurological:     General: No focal deficit present.     Mental Status: She is alert and oriented to person, place, and time.     Motor: No weakness or abnormal muscle tone.     Coordination: Coordination normal.  Psychiatric:        Mood and Affect: Mood normal.        Behavior: Behavior normal.      Imaging: No results found.

## 2022-03-01 ENCOUNTER — Encounter: Payer: Self-pay | Admitting: Orthopaedic Surgery

## 2022-03-01 ENCOUNTER — Ambulatory Visit (INDEPENDENT_AMBULATORY_CARE_PROVIDER_SITE_OTHER): Payer: Self-pay | Admitting: Orthopaedic Surgery

## 2022-03-01 DIAGNOSIS — G5602 Carpal tunnel syndrome, left upper limb: Secondary | ICD-10-CM

## 2022-03-01 DIAGNOSIS — G5601 Carpal tunnel syndrome, right upper limb: Secondary | ICD-10-CM | POA: Insufficient documentation

## 2022-03-01 NOTE — Progress Notes (Signed)
Office Visit Note   Patient: [name]           Date of Birth: 11-08-1933           MRN: [id number] Visit Date: 03/01/2022              Requested by: Elwyn Reach, MD [address] [place],  [place] [address] PCP: Elwyn Reach, MD   Assessment & Plan: Visit Diagnoses:  1. Right carpal tunnel syndrome   2. Left carpal tunnel syndrome     Plan: Haidee returns today to discuss recent nerve conduction studies.  These showed severe bilateral carpal tunnel syndrome worse on the right.  Her symptoms are unchanged.  Examination of bilateral hands show positive carpal tunnel compressive signs.  She has flattening of the thenar compartment.  Feels burning pain in the fingers.  Based on nerve conduction studies I have recommended carpal tunnel release.  Based on her options she agrees to move forward with a right carpal tunnel release first.  Risk benefits prognosis reviewed with the patient.  Debbie met with the patient today.  Follow-Up Instructions: No follow-ups on file.   Orders:  No orders of the defined types were placed in this encounter.  No orders of the defined types were placed in this encounter.     Procedures: No procedures performed   Clinical Data: No additional findings.   Subjective: Chief Complaint  Patient presents with   Right Hand - Follow-up    EMG review   Left Hand - Follow-up    EMG review    HPI  Review of Systems  Constitutional: Negative.   HENT: Negative.    Eyes: Negative.   Respiratory: Negative.    Cardiovascular: Negative.   Endocrine: Negative.   Musculoskeletal: Negative.   Neurological: Negative.   Hematological: Negative.   Psychiatric/Behavioral: Negative.    All other systems reviewed and are negative.    Objective: Vital Signs: There were no vitals taken for this visit.  Physical Exam Vitals and nursing note reviewed.  Constitutional:      Appearance: She is well-developed.   Pulmonary:     Effort: Pulmonary effort is normal.  Skin:    General: Skin is warm.     Capillary Refill: Capillary refill takes less than 2 seconds.  Neurological:     Mental Status: She is alert and oriented to person, place, and time.  Psychiatric:        Behavior: Behavior normal.        Thought Content: Thought content normal.        Judgment: Judgment normal.     Ortho Exam  Specialty Comments:  No specialty comments available.  Imaging: No results found.   PMFS History: Patient Active Problem List   Diagnosis Date Noted   Right carpal tunnel syndrome 03/01/2022   Left carpal tunnel syndrome 03/01/2022   Pain in both hands 08/14/2020   Primary osteoarthritis of both hips 08/14/2020   Primary osteoarthritis of both knees 08/14/2020   DDD (degenerative disc disease), lumbar 08/14/2020   EAR PAIN, RIGHT 09/02/2007   POSTMENOPAUSAL STATUS 03/28/2007   HYPERLIPIDEMIA 05/27/2006   ANEMIA, MICROCYTIC 05/27/2006   HYPERTENSION 05/27/2006   ALLERGIC RHINITIS 05/27/2006   RECTOVAGINAL FISTULA 05/27/2006   OSTEOARTHRITIS 05/27/2006   HAMSTRING TENDINITIS 05/27/2006   OSTEOPENIA 05/27/2006   INSOMNIA 05/27/2006   Past Medical History:  Diagnosis Date   Allergic rhinitis    Allergic rhinitis  Hamstring tendonitis at origin    Right hamstring   Hyperlipidemia    Hypertension    Well controlled.    Insomnia    Microcytic anemia    Baseline about 11.  MCV approx 70. Ferritin 196 in 9/08   Osteoarthritis    Bilateral knees, x ray 6/05:  Right knee- Severe tricompartmental degenerative arthritis with loose bodies.   Osteopenia    Hx, no longer osteopenic,  normal DEXA 11/07   Rectovaginal fistula    Seen during colonoscopy    Family History  Problem Relation Age of Onset   Breast cancer Mother    Breast cancer Sister    Cancer Brother    Healthy Son     Past Surgical History:  Procedure Laterality Date   CATARACT EXTRACTION, BILATERAL     KNEE  ARTHROSCOPY Bilateral    Social History   Occupational History   Not on file  Tobacco Use   Smoking status: Never   Smokeless tobacco: Never  Vaping Use   Vaping Use: Never used  Substance and Sexual Activity   Alcohol use: No   Drug use: No   Sexual activity: Not on file

## 2022-03-06 ENCOUNTER — Other Ambulatory Visit: Payer: Self-pay

## 2022-03-06 ENCOUNTER — Encounter (HOSPITAL_BASED_OUTPATIENT_CLINIC_OR_DEPARTMENT_OTHER): Payer: Self-pay | Admitting: Orthopaedic Surgery

## 2022-03-07 ENCOUNTER — Other Ambulatory Visit: Payer: Self-pay | Admitting: Physician Assistant

## 2022-03-08 ENCOUNTER — Encounter (HOSPITAL_BASED_OUTPATIENT_CLINIC_OR_DEPARTMENT_OTHER)
Admission: RE | Admit: 2022-03-08 | Discharge: 2022-03-08 | Disposition: A | Discharge status: Home / Self Care | Payer: Medicare (Managed Care) | Source: Ambulatory Visit | Attending: Orthopaedic Surgery | Admitting: Orthopaedic Surgery

## 2022-03-08 DIAGNOSIS — Z01812 Encounter for preprocedural laboratory examination: Secondary | ICD-10-CM | POA: Insufficient documentation

## 2022-03-08 DIAGNOSIS — G5601 Carpal tunnel syndrome, right upper limb: Secondary | ICD-10-CM | POA: Insufficient documentation

## 2022-03-08 LAB — TYPE AND SCREEN
ABO/RH(D): O POS
Antibody Screen: NEGATIVE

## 2022-03-08 LAB — BASIC METABOLIC PANEL
Anion gap: 10 (ref 5–15)
BUN: 18 mg/dL (ref 8–23)
CO2: 22 mmol/L (ref 22–32)
Calcium: 10.1 mg/dL (ref 8.9–10.3)
Chloride: 108 mmol/L (ref 98–111)
Creatinine, Ser: 0.98 mg/dL (ref 0.44–1.00)
GFR, Estimated: 56 mL/min — ABNORMAL LOW (ref >60)
Glucose, Bld: 98 mg/dL (ref 70–99)
Potassium: 4.5 mmol/L (ref 3.5–5.1)
Sodium: 140 mmol/L (ref 135–145)

## 2022-03-08 NOTE — Progress Notes (Signed)
Enhanced Recovery after Surgery  Enhanced Recovery after Surgery is a protocol used to improve the stress on your body and your recovery after surgery.  Patient Instructions  The night before surgery:  No food after midnight. ONLY clear liquids after midnight  The day of surgery (if you do NOT have diabetes):  Drink ONE (1) Pre-Surgery Clear Ensure as directed.   This drink was given to you during your hospital  pre-op appointment visit. The pre-op nurse will instruct you on the time to drink the  Pre-Surgery Ensure depending on your surgery time. Finish the drink at the designated time by the pre-op nurse.  Nothing else to drink after completing the  Pre-Surgery Clear Ensure.  The day of surgery (if you have diabetes): Drink ONE (1) Gatorade 2 (G2) as directed. This drink was given to you during your hospital  pre-op appointment visit.  The pre-op nurse will instruct you on the time to drink the   Gatorade 2 (G2) depending on your surgery time. Color of the Gatorade may vary. Red is not allowed. Nothing else to drink after completing the  Gatorade 2 (G2).         If office.you have questions, please contact your surgeon's office Enhanced Recovery after Surgery  Enhanced Recovery after Surgery is a protocol used to improve the stress on your body and your recovery after surgery.  Patient Instructions  The night before surgery:  No food after midnight. ONLY clear liquids after midnight  The day of surgery (if you do NOT have diabetes):  Drink ONE (1) Pre-Surgery Clear Ensure as directed.   This drink was given to you during your hospital  pre-op appointment visit. The pre-op nurse will instruct you on the time to drink the  Pre-Surgery Ensure depending on your surgery time. Finish the drink at the designated time by the pre-op nurse.  Nothing else to drink after completing the  Pre-Surgery Clear Ensure.  The day of surgery (if you have diabetes): Drink ONE (1) Gatorade 2  (G2) as directed. This drink was given to you during your hospital  pre-op appointment visit.  The pre-op nurse will instruct you on the time to drink the   Gatorade 2 (G2) depending on your surgery time. Color of the Gatorade may vary. Red is not allowed. Nothing else to drink after completing the  Gatorade 2 (G2).         If office.you have questions, please contact your surgeon's office  

## 2022-03-14 ENCOUNTER — Ambulatory Visit (HOSPITAL_BASED_OUTPATIENT_CLINIC_OR_DEPARTMENT_OTHER)
Admission: RE | Admit: 2022-03-14 | Payer: Medicare (Managed Care) | Source: Ambulatory Visit | Admitting: Orthopaedic Surgery

## 2022-03-14 DIAGNOSIS — G5601 Carpal tunnel syndrome, right upper limb: Secondary | ICD-10-CM

## 2022-03-14 DIAGNOSIS — Z01818 Encounter for other preprocedural examination: Secondary | ICD-10-CM

## 2022-03-14 SURGERY — CARPAL TUNNEL RELEASE
Anesthesia: Monitor Anesthesia Care | Site: Wrist | Laterality: Right

## 2022-03-21 ENCOUNTER — Encounter: Payer: Medicare (Managed Care) | Admitting: Orthopaedic Surgery

## 2022-05-15 ENCOUNTER — Other Ambulatory Visit: Payer: Self-pay | Admitting: Physician Assistant

## 2022-09-08 ENCOUNTER — Other Ambulatory Visit: Payer: Self-pay | Admitting: Physician Assistant

## 2023-06-21 ENCOUNTER — Encounter: Payer: Self-pay | Admitting: Podiatry

## 2023-06-21 ENCOUNTER — Ambulatory Visit (INDEPENDENT_AMBULATORY_CARE_PROVIDER_SITE_OTHER): Payer: Medicare Other | Admitting: Podiatry

## 2023-06-21 DIAGNOSIS — B351 Tinea unguium: Secondary | ICD-10-CM

## 2023-06-21 DIAGNOSIS — M79675 Pain in left toe(s): Secondary | ICD-10-CM | POA: Diagnosis not present

## 2023-06-21 DIAGNOSIS — M79674 Pain in right toe(s): Secondary | ICD-10-CM | POA: Diagnosis not present

## 2023-06-21 NOTE — Progress Notes (Unsigned)
  Subjective:  Patient ID: [name], female    DOB: 01-09-1940,  MRN: [id number]  Chief Complaint  Patient presents with   Callouses    PT is here due to corn on the greater toe on the right foot.   89 y.o. female returns for the above complaint.  Patient presents with thickened elongated dystrophic mycotic toenails x 10 mild pain on palpation she would like for me debride down she is unable to do it herself denies any other acute complaints  Objective:  There were no vitals filed for this visit. Podiatric Exam: Vascular: dorsalis pedis and posterior tibial pulses are palpable bilateral. Capillary return is immediate. Temperature gradient is WNL. Skin turgor WNL  Sensorium: Normal Semmes Weinstein monofilament test. Normal tactile sensation bilaterally. Nail Exam: Pt has thick disfigured discolored nails with subungual debris noted bilateral entire nail hallux through fifth toenails.  Pain on palpation to the nails. Ulcer Exam: There is no evidence of ulcer or pre-ulcerative changes or infection. Orthopedic Exam: Muscle tone and strength are WNL. No limitations in general ROM. No crepitus or effusions noted.  Skin: No Porokeratosis. No infection or ulcers    Assessment & Plan:   1. Pain due to onychomycosis of toenails of both feet     Patient was evaluated and treated and all questions answered.  Onychomycosis with pain  -Nails palliatively debrided as below. -Educated on self-care  Procedure: Nail Debridement Rationale: pain  Type of Debridement: manual, sharp debridement. Instrumentation: Nail nipper, rotary burr. Number of Nails: 10  Procedures and Treatment: Consent by patient was obtained for treatment procedures. The patient understood the discussion of treatment and procedures well. All questions were answered thoroughly reviewed. Debridement of mycotic and hypertrophic toenails, 1 through 5 bilateral and clearing of subungual debris. No ulceration, no infection  noted.  Return Visit-Office Procedure: Patient instructed to return to the office for a follow up visit 3 months for continued evaluation and treatment.  Nicholes Rough, DPM    No follow-ups on file.

## 2023-09-20 ENCOUNTER — Ambulatory Visit: Payer: Medicare (Managed Care) | Admitting: Podiatry

## 2023-10-18 ENCOUNTER — Ambulatory Visit: Payer: Medicare (Managed Care) | Admitting: Podiatry

## 2023-10-24 ENCOUNTER — Ambulatory Visit: Payer: Medicare (Managed Care) | Admitting: Podiatry

## 2023-11-08 ENCOUNTER — Ambulatory Visit (INDEPENDENT_AMBULATORY_CARE_PROVIDER_SITE_OTHER): Payer: Medicare (Managed Care) | Admitting: Podiatry

## 2023-11-08 DIAGNOSIS — M79674 Pain in right toe(s): Secondary | ICD-10-CM

## 2023-11-08 DIAGNOSIS — M79675 Pain in left toe(s): Secondary | ICD-10-CM | POA: Diagnosis not present

## 2023-11-08 DIAGNOSIS — B351 Tinea unguium: Secondary | ICD-10-CM | POA: Diagnosis not present

## 2023-11-08 NOTE — Progress Notes (Signed)
  Subjective:  Patient ID: [name], female    DOB: Mar 24, 1934,  MRN: [id number]  No chief complaint on file.  89 y.o. female returns for the above complaint.  Patient presents with thickened and onychodystrophy mycotic toenails x 10 mild pain on palpation worse with ambulation is with pressure patient would like for me debride down she is not able to herself denies any other acute complaints  Objective:  There were no vitals filed for this visit. Podiatric Exam: Vascular: dorsalis pedis and posterior tibial pulses are palpable bilateral. Capillary return is immediate. Temperature gradient is WNL. Skin turgor WNL  Sensorium: Normal Semmes Weinstein monofilament test. Normal tactile sensation bilaterally. Nail Exam: Pt has thick disfigured discolored nails with subungual debris noted bilateral entire nail hallux through fifth toenails.  Pain on palpation to the nails. Ulcer Exam: There is no evidence of ulcer or pre-ulcerative changes or infection. Orthopedic Exam: Muscle tone and strength are WNL. No limitations in general ROM. No crepitus or effusions noted.  Skin: No Porokeratosis. No infection or ulcers    Assessment & Plan:   1. Pain due to onychomycosis of toenails of both feet     Patient was evaluated and treated and all questions answered.  Onychomycosis with pain  -Nails palliatively debrided as below. -Educated on self-care  Procedure: Nail Debridement Rationale: pain  Type of Debridement: manual, sharp debridement. Instrumentation: Nail nipper, rotary burr. Number of Nails: 10  Procedures and Treatment: Consent by patient was obtained for treatment procedures. The patient understood the discussion of treatment and procedures well. All questions were answered thoroughly reviewed. Debridement of mycotic and hypertrophic toenails, 1 through 5 bilateral and clearing of subungual debris. No ulceration, no infection noted.  Return Visit-Office Procedure: Patient  instructed to return to the office for a follow up visit 3 months for continued evaluation and treatment.  Tinnie Forehand, DPM    Return in about 3 months (around 02/08/2024) for Routine Foot Care.

## 2024-02-10 ENCOUNTER — Encounter: Payer: Self-pay | Admitting: Podiatry

## 2024-02-10 ENCOUNTER — Ambulatory Visit (INDEPENDENT_AMBULATORY_CARE_PROVIDER_SITE_OTHER): Payer: Medicare (Managed Care) | Admitting: Podiatry

## 2024-02-10 DIAGNOSIS — B351 Tinea unguium: Secondary | ICD-10-CM | POA: Diagnosis not present

## 2024-02-10 DIAGNOSIS — M79675 Pain in left toe(s): Secondary | ICD-10-CM

## 2024-02-10 DIAGNOSIS — L84 Corns and callosities: Secondary | ICD-10-CM | POA: Diagnosis not present

## 2024-02-10 DIAGNOSIS — M79674 Pain in right toe(s): Secondary | ICD-10-CM

## 2024-02-10 NOTE — Progress Notes (Signed)
 This patient presents to the office with chief complaint of long thick painful nails.  Patient says the nails are painful walking and wearing shoes.  This patient is unable to self treat.  This patient is unable to trim her nails since she is unable to reach her nails.   She has painful callus both forefeet  B/L. She presents to the office for preventative foot care services.  General Appearance  Alert, conversant and in no acute stress.  Vascular  Dorsalis pedis and posterior tibial  pulses are  weakly palpable  bilaterally.  Capillary return is within normal limits  bilaterally. Temperature is within normal limits  bilaterally.  Neurologic  Senn-Weinstein monofilament wire test within normal limits  bilaterally. Muscle power within normal limits bilaterally.  Nails Thick disfigured discolored nails with subungual debris  from hallux to fifth toes bilaterally. No evidence of bacterial infection or drainage bilaterally.  Orthopedic  No limitations of motion  feet .  No crepitus or effusions noted.  No bony pathology or digital deformities noted. HAV  B/L.  Skin  normotropic skin with no porokeratosis noted bilaterally.  No signs of infections or ulcers noted.   Callus sub 3,5 left foot and sub 5th right foot.  Pinch callus right foot.  Onychomycosis  Nails  B/L.  Pain in right toes  Pain in left toes  Callus  B/L.  Debridement of nails both feet followed trimming the nails with dremel tool. Debridement of callus forefoot  B/L.   RTC 3 months.   Cordella Bold DPM

## 2024-04-25 ENCOUNTER — Encounter (HOSPITAL_COMMUNITY): Payer: Self-pay | Admitting: Emergency Medicine

## 2024-04-25 ENCOUNTER — Inpatient Hospital Stay (HOSPITAL_COMMUNITY)
Admission: EM | Admit: 2024-04-25 | Discharge: 2024-05-04 | DRG: 744 | Disposition: A | Discharge status: Skilled Nursing Facility | Payer: Medicare (Managed Care) | Attending: Family Medicine | Admitting: Family Medicine

## 2024-04-25 ENCOUNTER — Emergency Department (HOSPITAL_COMMUNITY): Payer: Medicare (Managed Care)

## 2024-04-25 ENCOUNTER — Other Ambulatory Visit: Payer: Self-pay

## 2024-04-25 DIAGNOSIS — I160 Hypertensive urgency: Secondary | ICD-10-CM | POA: Diagnosis present

## 2024-04-25 DIAGNOSIS — Z880 Allergy status to penicillin: Secondary | ICD-10-CM

## 2024-04-25 DIAGNOSIS — Z9841 Cataract extraction status, right eye: Secondary | ICD-10-CM

## 2024-04-25 DIAGNOSIS — D259 Leiomyoma of uterus, unspecified: Secondary | ICD-10-CM | POA: Diagnosis present

## 2024-04-25 DIAGNOSIS — N1831 Chronic kidney disease, stage 3a: Secondary | ICD-10-CM | POA: Diagnosis present

## 2024-04-25 DIAGNOSIS — N179 Acute kidney failure, unspecified: Secondary | ICD-10-CM | POA: Diagnosis not present

## 2024-04-25 DIAGNOSIS — R9389 Abnormal findings on diagnostic imaging of other specified body structures: Secondary | ICD-10-CM

## 2024-04-25 DIAGNOSIS — R310 Gross hematuria: Secondary | ICD-10-CM | POA: Diagnosis present

## 2024-04-25 DIAGNOSIS — E876 Hypokalemia: Secondary | ICD-10-CM | POA: Diagnosis present

## 2024-04-25 DIAGNOSIS — M170 Bilateral primary osteoarthritis of knee: Secondary | ICD-10-CM | POA: Diagnosis present

## 2024-04-25 DIAGNOSIS — D509 Iron deficiency anemia, unspecified: Secondary | ICD-10-CM | POA: Diagnosis present

## 2024-04-25 DIAGNOSIS — I440 Atrioventricular block, first degree: Secondary | ICD-10-CM | POA: Diagnosis present

## 2024-04-25 DIAGNOSIS — D62 Acute posthemorrhagic anemia: Secondary | ICD-10-CM | POA: Diagnosis not present

## 2024-04-25 DIAGNOSIS — I10 Essential (primary) hypertension: Secondary | ICD-10-CM | POA: Diagnosis not present

## 2024-04-25 DIAGNOSIS — E119 Type 2 diabetes mellitus without complications: Secondary | ICD-10-CM | POA: Diagnosis not present

## 2024-04-25 DIAGNOSIS — Z882 Allergy status to sulfonamides status: Secondary | ICD-10-CM

## 2024-04-25 DIAGNOSIS — Z79899 Other long term (current) drug therapy: Secondary | ICD-10-CM | POA: Diagnosis not present

## 2024-04-25 DIAGNOSIS — N950 Postmenopausal bleeding: Principal | ICD-10-CM | POA: Diagnosis present

## 2024-04-25 DIAGNOSIS — I21A1 Myocardial infarction type 2: Secondary | ICD-10-CM | POA: Diagnosis present

## 2024-04-25 DIAGNOSIS — D631 Anemia in chronic kidney disease: Secondary | ICD-10-CM | POA: Diagnosis present

## 2024-04-25 DIAGNOSIS — I1310 Hypertensive heart and chronic kidney disease without heart failure, with stage 1 through stage 4 chronic kidney disease, or unspecified chronic kidney disease: Secondary | ICD-10-CM | POA: Diagnosis present

## 2024-04-25 DIAGNOSIS — Z9842 Cataract extraction status, left eye: Secondary | ICD-10-CM | POA: Diagnosis not present

## 2024-04-25 DIAGNOSIS — R7989 Other specified abnormal findings of blood chemistry: Secondary | ICD-10-CM | POA: Diagnosis present

## 2024-04-25 DIAGNOSIS — Z66 Do not resuscitate: Secondary | ICD-10-CM | POA: Diagnosis present

## 2024-04-25 DIAGNOSIS — N823 Fistula of vagina to large intestine: Secondary | ICD-10-CM | POA: Diagnosis not present

## 2024-04-25 DIAGNOSIS — M8580 Other specified disorders of bone density and structure, unspecified site: Secondary | ICD-10-CM | POA: Diagnosis present

## 2024-04-25 DIAGNOSIS — Z803 Family history of malignant neoplasm of breast: Secondary | ICD-10-CM | POA: Diagnosis not present

## 2024-04-25 DIAGNOSIS — E785 Hyperlipidemia, unspecified: Secondary | ICD-10-CM | POA: Diagnosis present

## 2024-04-25 LAB — BASIC METABOLIC PANEL WITH GFR
Anion gap: 14 (ref 5–15)
BUN: 16 mg/dL (ref 8–23)
CO2: 18 mmol/L — ABNORMAL LOW (ref 22–32)
Calcium: 10.5 mg/dL — ABNORMAL HIGH (ref 8.9–10.3)
Chloride: 107 mmol/L (ref 98–111)
Creatinine, Ser: 0.79 mg/dL (ref 0.44–1.00)
GFR, Estimated: >60 mL/min (ref >60)
Glucose, Bld: 99 mg/dL (ref 70–99)
Potassium: 3.6 mmol/L (ref 3.5–5.1)
Sodium: 139 mmol/L (ref 135–145)

## 2024-04-25 LAB — TROPONIN I (HIGH SENSITIVITY)
Troponin I (High Sensitivity): 269 ng/L (ref <18)
Troponin I (High Sensitivity): 366 ng/L (ref <18)

## 2024-04-25 LAB — CBC
HCT: 39.5 % (ref 36.0–46.0)
Hemoglobin: 12.1 g/dL (ref 12.0–15.0)
MCH: 22.8 pg — ABNORMAL LOW (ref 26.0–34.0)
MCHC: 30.6 g/dL (ref 30.0–36.0)
MCV: 74.4 fL — ABNORMAL LOW (ref 80.0–100.0)
Platelets: 252 K/uL (ref 150–400)
RBC: 5.31 MIL/uL — ABNORMAL HIGH (ref 3.87–5.11)
RDW: 15.9 % — ABNORMAL HIGH (ref 11.5–15.5)
WBC: 6.9 K/uL (ref 4.0–10.5)
nRBC: 0 % (ref 0.0–0.2)

## 2024-04-25 LAB — URINALYSIS, ROUTINE W REFLEX MICROSCOPIC
Bacteria, UA: NONE SEEN
Bilirubin Urine: NEGATIVE
Glucose, UA: NEGATIVE mg/dL
Ketones, ur: NEGATIVE mg/dL
Leukocytes,Ua: NEGATIVE
Nitrite: NEGATIVE
Protein, ur: 100 mg/dL — AB
Specific Gravity, Urine: 1.011 (ref 1.005–1.030)
pH: 7 (ref 5.0–8.0)

## 2024-04-25 MED ORDER — CLONIDINE HCL 0.2 MG PO TABS
0.3000 mg | ORAL_TABLET | Freq: Once | ORAL | Status: AC
  Administered 2024-04-25: 0.3 mg via ORAL
  Filled 2024-04-25: qty 1

## 2024-04-25 MED ORDER — ACETAMINOPHEN 650 MG RE SUPP: 650.0000 mg | Freq: Four times a day (QID) | RECTAL | Status: DC | PRN

## 2024-04-25 MED ORDER — SENNOSIDES-DOCUSATE SODIUM 8.6-50 MG PO TABS: 1.0000 | ORAL_TABLET | Freq: Every evening | ORAL | Status: DC | PRN

## 2024-04-25 MED ORDER — LABETALOL HCL 5 MG/ML IV SOLN: 20.0000 mg | Freq: Once | INTRAVENOUS | Status: DC

## 2024-04-25 MED ORDER — IOHEXOL 350 MG/ML SOLN
75.0000 mL | Freq: Once | INTRAVENOUS | Status: AC | PRN
  Administered 2024-04-25: 75 mL via INTRAVENOUS

## 2024-04-25 MED ORDER — ENOXAPARIN SODIUM 40 MG/0.4ML IJ SOSY
40.0000 mg | PREFILLED_SYRINGE | INTRAMUSCULAR | Status: DC
Start: 2024-04-26 — End: 2024-05-02
  Administered 2024-04-26 – 2024-05-02 (×6): 40 mg via SUBCUTANEOUS
  Filled 2024-04-25 (×6): qty 0.4

## 2024-04-25 MED ORDER — DILTIAZEM HCL 25 MG/5ML IV SOLN
15.0000 mg | Freq: Once | INTRAVENOUS | Status: AC
  Administered 2024-04-25: 15 mg via INTRAVENOUS
  Filled 2024-04-25: qty 5

## 2024-04-25 MED ORDER — HYDRALAZINE HCL 25 MG PO TABS
25.0000 mg | ORAL_TABLET | Freq: Once | ORAL | Status: AC
  Administered 2024-04-25: 25 mg via ORAL
  Filled 2024-04-25: qty 1

## 2024-04-25 MED ORDER — LOSARTAN POTASSIUM 50 MG PO TABS
100.0000 mg | ORAL_TABLET | Freq: Once | ORAL | Status: AC
  Administered 2024-04-25: 100 mg via ORAL
  Filled 2024-04-25: qty 2

## 2024-04-25 MED ORDER — ONDANSETRON HCL 4 MG PO TABS: 4.0000 mg | ORAL_TABLET | Freq: Four times a day (QID) | ORAL | Status: DC | PRN

## 2024-04-25 MED ORDER — BISACODYL 5 MG PO TBEC: 5.0000 mg | DELAYED_RELEASE_TABLET | Freq: Every day | ORAL | Status: DC | PRN

## 2024-04-25 MED ORDER — LABETALOL HCL 5 MG/ML IV SOLN
10.0000 mg | Freq: Once | INTRAVENOUS | Status: AC
  Administered 2024-04-25: 10 mg via INTRAVENOUS
  Filled 2024-04-25: qty 4

## 2024-04-25 MED ORDER — ONDANSETRON HCL 4 MG/2ML IJ SOLN: 4.0000 mg | Freq: Four times a day (QID) | INTRAMUSCULAR | Status: DC | PRN

## 2024-04-25 MED ORDER — ACETAMINOPHEN 325 MG PO TABS
650.0000 mg | ORAL_TABLET | Freq: Four times a day (QID) | ORAL | Status: DC | PRN
  Administered 2024-05-02: 650 mg via ORAL
  Filled 2024-04-25: qty 2

## 2024-04-25 NOTE — ED Notes (Addendum)
 Pt attempted to urinate but completely missed the urine hat in the toilet.

## 2024-04-25 NOTE — ED Notes (Signed)
 Plunkett MD contacted via face to face conversation about critical lab value troponin 269.

## 2024-04-25 NOTE — ED Triage Notes (Signed)
 Pt [facility] from home  for hematuria. She noticed bright red blood when wiping after urination. PT is unsure where its coming from.  Started last night.    Hx of HTN. No meds today.   233/155 cBG 99 99% RA, HR 70-115  EMS noted irregular rhythm on monitor without hx of same.

## 2024-04-25 NOTE — H&P (Incomplete)
 History and Physical  [name] FMW:[id number] DOB: 1933/12/17 DOA: 04/25/2024  PCP: Sim Emery CROME, MD   Chief Complaint: Hematuria  HPI: [name] is a 89 y.o. female with medical history significant for uncontrolled hypertension, hyperlipidemia, osteoarthritis, rectovaginal fistula, anemia and insomnia who presented to the ED for evaluation of hematuria.  ED Course: Initial vitals show patient afebrile, HR 90-110s, SBP 170-230s, SpO2 100% on room air. Initial labs significant for troponin 269-366, bicarb 18, creatinine 0.79, calcium 10.5, WBC 6.9, Hgb 12.1, platelet 252, UA shows small hemoglobinuria and moderate proteinuria but no signs of infection. EKG shows sinus rhythm with slightly prolonged PR interval.  CT A/P shows abnormal endometrial thickening measuring up to 2.3 cm, moderate stool throughout the colon and fibroid uterus.  Pt received her  home BP meds.  Cardiology was consulted for evaluation, recommended trending troponin, evaluated with TTE and placing a formal consult if abnormal or pt starts having chest pain. TRH was consulted for admission.   Review of Systems: Please see HPI for pertinent positives and negatives. A complete 10 system review of systems are otherwise negative.  Past Medical History:  Diagnosis Date   Allergic rhinitis    Allergic rhinitis    Hamstring tendonitis at origin    Right hamstring   Hyperlipidemia    Hypertension    Well controlled.    Insomnia    Microcytic anemia    Baseline about 11.  MCV approx 70. Ferritin 196 in 9/08   Osteoarthritis    Bilateral knees, x ray 6/05:  Right knee- Severe tricompartmental degenerative arthritis with loose bodies.   Osteopenia    Hx, no longer osteopenic,  normal DEXA 11/07   Rectovaginal fistula    Seen during colonoscopy   Past Surgical History:  Procedure Laterality Date   CATARACT EXTRACTION, BILATERAL     KNEE ARTHROSCOPY Bilateral    Social History:  reports that  she has never smoked. She has never used smokeless tobacco. She reports that she does not drink alcohol and does not use drugs.  Allergies  Allergen Reactions   Penicillins Other (See Comments)    Skin turned purple   Sulfonamide Derivatives Other (See Comments)    swell    Family History  Problem Relation Age of Onset   Breast cancer Mother    Breast cancer Sister    Cancer Brother    Healthy Son      Prior to Admission medications   Medication Sig Start Date End Date Taking? Authorizing Provider  cloNIDine  (CATAPRES ) 0.3 MG tablet Take 0.3 mg by mouth 3 (three) times daily. 12/13/20   [provider]  COVID-19 mRNA Vac-TriS, Pfizer, SUSP injection Inject into the muscle. 01/30/21   Luiz Channel, MD  diclofenac  (VOLTAREN ) 75 MG EC tablet Take 1 tablet (75 mg total) by mouth 2 (two) times daily as needed for mild pain. 11/17/21   Jule Ronal CROME, PA-C  diclofenac  Sodium (VOLTAREN ) 1 % GEL Apply 2 g topically 4 (four) times daily. 02/15/21   Jule Ronal CROME, PA-C  Flaxseed, Linseed, (FLAXSEED OIL PO) Take by mouth.    [provider]  furosemide (LASIX) 40 MG tablet Take 40 mg by mouth daily. 07/27/20   [provider]  hydrALAZINE (APRESOLINE) 25 MG tablet Take 25 mg by mouth 3 (three) times daily. 12/08/20   [provider]  ibuprofen (ADVIL) 800 MG tablet Take 800 mg by mouth 3 (three) times daily. 08/22/20   [provider]  loratadine (CLARITIN) 10 MG tablet Take 10 mg by mouth daily.    [provider]  losartan (COZAAR) 100 MG tablet Take 100 mg by mouth daily. 07/27/20   [provider]  Multiple Vitamins-Minerals (CENTRUM PO) Take by mouth.    [provider]  simvastatin (ZOCOR) 20 MG tablet Take 20 mg by mouth at bedtime. 07/27/20   [provider]  traMADol (ULTRAM) 50 MG tablet Take 50 mg by mouth 4 (four) times daily. 05/10/20   [provider]    Physical Exam: BP (!) 208/101   Pulse  88   Temp 98.6 F (37 C) (Oral)   Resp 16   SpO2 100%  General: Pleasant, well-appearing *** laying in bed. No acute distress. HEENT: [place]/AT. Anicteric sclera CV: RRR. No murmurs, rubs, or gallops. No LE edema Pulmonary: Lungs CTAB. Normal effort. No wheezing or rales. Abdominal: Soft, nontender, nondistended. Normal bowel sounds. Extremities: Palpable radial and DP pulses. Normal ROM. Skin: Warm and dry. No obvious rash or lesions. Neuro: A&Ox3. Moves all extremities. Normal sensation to light touch. No focal deficit. Psych: Normal mood and affect          Labs on Admission:  Basic Metabolic Panel: Recent Labs  Lab 04/25/24 1507  NA 139  K 3.6  CL 107  CO2 18*  GLUCOSE 99  BUN 16  CREATININE 0.79  CALCIUM 10.5*   Liver Function Tests: No results for input(s): AST, ALT, ALKPHOS, BILITOT, PROT, ALBUMIN in the last 168 hours. No results for input(s): LIPASE, AMYLASE in the last 168 hours. No results for input(s): AMMONIA in the last 168 hours. CBC: Recent Labs  Lab 04/25/24 1419  WBC 6.9  HGB 12.1  HCT 39.5  MCV 74.4*  PLT 252   Cardiac Enzymes: No results for input(s): CKTOTAL, CKMB, CKMBINDEX, TROPONINI in the last 168 hours. BNP (last 3 results) No results for input(s): BNP in the last 8760 hours.  ProBNP (last 3 results) No results for input(s): PROBNP in the last 8760 hours.  CBG: No results for input(s): GLUCAP in the last 168 hours.  Radiological Exams on Admission: CT ABDOMEN PELVIS W CONTRAST Result Date: 04/25/2024 CLINICAL DATA:  Suprapubic pain with vaginal versus rectal bleeding EXAM: CT ABDOMEN AND PELVIS WITH CONTRAST TECHNIQUE: Multidetector CT imaging of the abdomen and pelvis was performed using the standard protocol following bolus administration of intravenous contrast. RADIATION DOSE REDUCTION: This exam was performed according to the departmental dose-optimization program which includes automated exposure  control, adjustment of the mA and/or kV according to patient size and/or use of iterative reconstruction technique. CONTRAST:  75mL OMNIPAQUE IOHEXOL 350 MG/ML SOLN COMPARISON:  None Available. FINDINGS: Lower chest: No acute pleural or parenchymal lung disease. Hepatobiliary: Cholecystectomy. Intrahepatic and extrahepatic biliary duct dilation likely related to prior cholecystectomy, with common bile duct measuring up to 13 mm. No focal liver abnormalities. Pancreas: Unremarkable. No pancreatic ductal dilatation or surrounding inflammatory changes. Spleen: Normal in size without focal abnormality. Adrenals/Urinary Tract: Multiple bilateral renal cortical cysts do not require specific imaging follow-up. No urinary tract calculi or obstructive uropathy. Bladder is unremarkable with no wall thickening or filling defect. The adrenals are normal. Stomach/Bowel: No bowel obstruction or ileus. Moderate stool throughout the colon. No bowel wall thickening or inflammatory change. The appendix, if still present, is not well visualized. Vascular/Lymphatic: Aortic atherosclerosis. No enlarged abdominal or pelvic lymph nodes. Reproductive: Multiple calcified and noncalcified uterine fibroids. Heterogeneous endometrial thickening measuring up to 2.3 cm. No adnexal  masses. Other: No free fluid or free intraperitoneal gas. No abdominal wall hernia. Musculoskeletal: Severe bilateral hip osteoarthritis. No acute displaced fractures. Diffuse thoracolumbar spondylosis and facet hypertrophy. Reconstructed images demonstrate no additional findings. IMPRESSION: 1. Abnormal endometrial thickening measuring up to 2.3 cm. Underlying endometrial carcinoma is a concern given history of possible vaginal bleeding. Endometrial sampling is recommended. 2. Moderate stool throughout the colon consistent with constipation. No bowel obstruction or ileus. 3. Cholecystectomy, with postsurgical dilatation of the biliary tree. 4. Fibroid uterus. 5.  Severe bilateral hip osteoarthritis. 6.  Aortic Atherosclerosis (ICD10-I70.0). Electronically Signed   By: Ozell Daring M.D.   On: 04/25/2024 21:10   Assessment/Plan [name] is a 89 y.o. female with medical history significant for uncontrolled hypertension, hyperlipidemia, osteoarthritis, rectovaginal fistula, anemia and insomnia who presented to the ED for evaluation of hematuria   # Hypertensive urgency  #***  #***  # HLD  #***  #***  #***  DVT prophylaxis: Lovenox     Code Status: Not on file  Consults called: Cardiology  Family Communication: ***  Severity of Illness: The appropriate patient status for this patient is INPATIENT. Inpatient status is judged to be reasonable and necessary in order to provide the required intensity of service to ensure the patient's safety. The patient's presenting symptoms, physical exam findings, and initial radiographic and laboratory data in the context of their chronic comorbidities is felt to place them at high risk for further clinical deterioration. Furthermore, it is not anticipated that the patient will be medically stable for discharge from the hospital within 2 midnights of admission.   * I certify that at the point of admission it is my clinical judgment that the patient will require inpatient hospital care spanning beyond 2 midnights from the point of admission due to high intensity of service, high risk for further deterioration and high frequency of surveillance required.*  Level of care: Telemetry Medical    Lou Claretta HERO, MD 04/25/2024, 10:07 PM [facility] Pager: [phone] Isaiah 41:10   If 7PM-7AM, please contact night-coverage [email or web address] Password [email or web address]

## 2024-04-25 NOTE — ED Provider Notes (Addendum)
[place] EMERGENCY DEPARTMENT AT [facility] Provider Note   CSN: [id number] Arrival date & time: 04/25/24  1319     Patient presents with: No chief complaint on file.   [name] is a 89 y.o. female.   Patient is an 89 year old female with a history of hypertension, hyperlipidemia, microcytic anemia and a rectovaginal fistula seen during a colonoscopy in the past who is presenting today with complaint of bleeding when she went to the bathroom.  She reports that she had an episode last night and another episode this morning.  She last had a bowel movement on Wednesday and did not report that she was going to the bathroom to have a bowel movement.  She thinks she was just going to urinate when she noticed the blood.  She initially was not having any pain but after waiting in the waiting room for multiple hours reports now she has some suprapubic pain.  She does report always having a history of high blood pressure and has not taken any of her blood pressure medications this morning.  She does not take any blood thinners.  She denies any chest pain, shortness of breath, dizziness.  She has never had bleeding like this before.  The history is provided by the patient and medical records.       Prior to Admission medications   Medication Sig Start Date End Date Taking? Authorizing Provider  cloNIDine  (CATAPRES ) 0.3 MG tablet Take 0.3 mg by mouth 3 (three) times daily. 12/13/20   [provider]  COVID-19 mRNA Vac-TriS, Pfizer, SUSP injection Inject into the muscle. 01/30/21   Luiz Channel, MD  diclofenac  (VOLTAREN ) 75 MG EC tablet Take 1 tablet (75 mg total) by mouth 2 (two) times daily as needed for mild pain. 11/17/21   Jule Ronal CROME, PA-C  diclofenac  Sodium (VOLTAREN ) 1 % GEL Apply 2 g topically 4 (four) times daily. 02/15/21   Jule Ronal CROME, PA-C  Flaxseed, Linseed, (FLAXSEED OIL PO) Take by mouth.    [provider]  furosemide (LASIX) 40 MG  tablet Take 40 mg by mouth daily. 07/27/20   [provider]  hydrALAZINE (APRESOLINE) 25 MG tablet Take 25 mg by mouth 3 (three) times daily. 12/08/20   [provider]  ibuprofen (ADVIL) 800 MG tablet Take 800 mg by mouth 3 (three) times daily. 08/22/20   [provider]  loratadine (CLARITIN) 10 MG tablet Take 10 mg by mouth daily.    [provider]  losartan (COZAAR) 100 MG tablet Take 100 mg by mouth daily. 07/27/20   [provider]  Multiple Vitamins-Minerals (CENTRUM PO) Take by mouth.    [provider]  simvastatin (ZOCOR) 20 MG tablet Take 20 mg by mouth at bedtime. 07/27/20   [provider]  traMADol (ULTRAM) 50 MG tablet Take 50 mg by mouth 4 (four) times daily. 05/10/20   [provider]    Allergies: Penicillins and Sulfonamide derivatives    Review of Systems  Updated Vital Signs BP (!) 176/107   Pulse (!) 102   Temp 97.7 F (36.5 C)   Resp 17   SpO2 100%   Physical Exam Vitals and nursing note reviewed.  Constitutional:      General: She is not in acute distress.    Appearance: She is well-developed.  HENT:     Head: Normocephalic and atraumatic.  Eyes:     Pupils: Pupils are equal, round, and reactive to light.  Cardiovascular:  Rate and Rhythm: Normal rate and regular rhythm.     Heart sounds: Normal heart sounds. No murmur heard.    No friction rub.  Pulmonary:     Effort: Pulmonary effort is normal.     Breath sounds: Normal breath sounds. No wheezing or rales.  Abdominal:     General: Bowel sounds are normal. There is no distension.     Palpations: Abdomen is soft.     Tenderness: There is abdominal tenderness in the suprapubic area. There is no guarding or rebound.  Genitourinary:    Comments: Patient has a rectovaginal fistula with no discrete rectum but all is connected.  There is blood coming from this area but unclear if it is coming from rectal cause or vaginal  cause. Musculoskeletal:        General: No tenderness. Normal range of motion.     Right lower leg: No edema.     Left lower leg: No edema.     Comments: No edema  Skin:    General: Skin is warm and dry.     Findings: No rash.  Neurological:     Mental Status: She is alert and oriented to person, place, and time. Mental status is at baseline.     Cranial Nerves: No cranial nerve deficit.  Psychiatric:        Behavior: Behavior normal.     (all labs ordered are listed, but only abnormal results are displayed) Labs Reviewed  URINALYSIS, ROUTINE W REFLEX MICROSCOPIC - Abnormal; Notable for the following components:      Result Value   Hgb urine dipstick SMALL (*)    Protein, ur 100 (*)    All other components within normal limits  CBC - Abnormal; Notable for the following components:   RBC 5.31 (*)    MCV 74.4 (*)    MCH 22.8 (*)    RDW 15.9 (*)    All other components within normal limits  BASIC METABOLIC PANEL WITH GFR - Abnormal; Notable for the following components:   CO2 18 (*)    Calcium 10.5 (*)    All other components within normal limits  TROPONIN I (HIGH SENSITIVITY) - Abnormal; Notable for the following components:   Troponin I (High Sensitivity) 269 (*)    All other components within normal limits  TROPONIN I (HIGH SENSITIVITY) - Abnormal; Notable for the following components:   Troponin I (High Sensitivity) 366 (*)    All other components within normal limits    EKG: EKG Interpretation Date/Time:  Saturday April 25 2024 13:42:18 EDT Ventricular Rate:  85 PR Interval:  192 QRS Duration:  64 QT Interval:  370 QTC Calculation: 440 R Axis:   53  Text Interpretation: Sinus rhythm with Premature atrial complexes Septal infarct , age undetermined No significant change since last tracing When compared with ECG of 27-Dec-2020 14:09, PREVIOUS ECG IS PRESENT Confirmed by Doretha Folks ([id number]) on 04/25/2024 6:03:23 PM  Radiology: No results  found.   Procedures   Medications Ordered in the ED  losartan (COZAAR) tablet 100 mg (100 mg Oral Given 04/25/24 1405)  hydrALAZINE (APRESOLINE) tablet 25 mg (25 mg Oral Given 04/25/24 1704)  iohexol (OMNIPAQUE) 350 MG/ML injection 75 mL (75 mLs Intravenous Contrast Given 04/25/24 2055)                                    Medical Decision Making Amount and/or  Complexity of Data Reviewed External Data Reviewed: notes. Labs: ordered. Decision-making details documented in ED Course. Radiology: ordered and independent interpretation performed. Decision-making details documented in ED Course. ECG/medicine tests: ordered and independent interpretation performed. Decision-making details documented in ED Course.  Risk Prescription drug management.   Pt with multiple medical problems and comorbidities and presenting today with a complaint that caries a high risk for morbidity and mortality.  Here today with the above complaint.  Patient is noted to be significantly hypertensive here despite getting her losartan and a dose of hydralazine.  She denies any chest pain shortness of breath or dizziness however troponins were ordered after the fact.  Concern for possible vaginal, urinary bleeding versus GI bleeding. Patient is now having suprapubic pain and will need physical exam to identify where the bleeding is coming from.  I independently interpreted patient's EKG and labs.  EKG without acute changes today no ST changes, troponin is elevated at 269 without old to compare, BMP without acute findings with normal BUN and CBC with stable hemoglobin of 12 and normal white count. Patient's repeat blood pressure is 176/107.  Due to bad knees and difficulty with bending her knees is very difficult to position patient to do an exam and it had to be done on her side.  She has no distinction between her vagina and rectum.  There is blood coming from this area but difficult to discern where it is coming from.  Do  not feel that she could be positioned to tolerate an ultrasound will do a CT of her abdomen pelvis.  Repeat troponin is pending.  8:58 PM Repeat troponin this continued to trend up at 366.  Repeat EKG still with no acute findings.  Patient continues to deny any chest pain.  Patient cannot be anticoagulated at this time given the bleeding and uncertain if it is coming from the vagina or the rectum. Discussed the case with Dr. Otelia with cardiology and feel this is most likely due to her significantly elevated blood pressure.  Since she is not having any chest pain or shortness of breath at this time do not feel that patient needs a cardiology consult however he will be available for consult if she does start developing chest pain or any EKG changes.  Patient will require admission for further care.  CT is still pending. Patient remains hypertensive and based on her medical record is supposed to take 0.3 of clonidine  and was given a dose of that.  CRITICAL CARE Performed by: Abbey Veith Total critical care time: 30 minutes Critical care time was exclusive of separately billable procedures and treating other patients. Critical care was necessary to treat or prevent imminent or life-threatening deterioration. Critical care was time spent personally by me on the following activities: development of treatment plan with patient and/or surrogate as well as nursing, discussions with consultants, evaluation of patient's response to treatment, examination of patient, obtaining history from patient or surrogate, ordering and performing treatments and interventions, ordering and review of laboratory studies, ordering and review of radiographic studies, pulse oximetry and re-evaluation of patient's condition.     Final diagnoses:  Uncontrolled hypertension  Elevated troponin  Rectovaginal fistula with involvement of anal sphincter    ED Discharge Orders     None          Doretha Folks,  MD 04/25/24 7942    Doretha Folks, MD 04/25/24 7941    Doretha Folks, MD 04/25/24 7896    Doretha Folks, MD  04/25/24 2109  

## 2024-04-25 NOTE — ED Provider Notes (Signed)
 CT scan of the abdomen pelvis does show abnormal endometrial thickening.  Suspect this could be the source of some of her bleeding today.  But her hemodynamics are stable.  Hemoglobin was okay.  She does have elevated troponin but fairly asymptomatic.  We do think that this is likely secondary to demand given high blood pressure.  She has been given her Catapres  and also diltiazem.  She has been given the rest of her p.o. blood pressure meds as well.  Cardiology is aware.  Can continue to manage her blood pressure get echocardiogram and further cardiac workup.  Can trend hemoglobin.  At this time we do not think there is any bleeding emergency at this time.  Can follow-up outpatient with OB/GYN.  Will admit to hospitalist for further blood pressure care cardiac workup.  This chart was dictated using voice recognition software.  Despite best efforts to proofread,  errors can occur which can change the documentation meaning.    Ruthe Cornet, DO 04/25/24 2147

## 2024-04-25 NOTE — ED Provider Triage Note (Signed)
 Emergency Medicine Provider Triage Evaluation Note  [name] , a 89 y.o. female  was evaluated in triage.  Pt complains of bleeding when she went to the bathroom x 2.  Once last night and once this morning.  She is not sure where the bleeding is coming from.  She has not had any bowel movements and thinks it must be from her urine or her vagina.  No prior history of similar.  She does not take anticoagulation. No abd pain.  Review of Systems  Positive: bleeding Negative: No abdominal pain, nausea, vomiting, chest pain or feeling lightheaded  Physical Exam  BP (!) 228/119   Pulse 85   Temp 98.3 F (36.8 C) (Oral)   Resp 16   SpO2 100%  Gen:   Awake, no distress   Resp:  Normal effort  MSK:   Moves extremities without difficulty  Other:  No abdominal pain  Medical Decision Making  Medically screening exam initiated at 1:50 PM.  Appropriate orders placed.  [name] was informed that the remainder of the evaluation will be completed by another provider, this initial triage assessment does not replace that evaluation, and the importance of remaining in the ED until their evaluation is complete.  Unclear where bleeding is coming from.  Patient will need more detailed exam to determine this.  No anticoagulation.  Patient is hypertensive but has not had her blood pressure medication this morning.   Doretha Folks, MD 04/25/24 1352

## 2024-04-26 DIAGNOSIS — R310 Gross hematuria: Secondary | ICD-10-CM | POA: Diagnosis not present

## 2024-04-26 DIAGNOSIS — R7989 Other specified abnormal findings of blood chemistry: Secondary | ICD-10-CM

## 2024-04-26 DIAGNOSIS — R9389 Abnormal findings on diagnostic imaging of other specified body structures: Secondary | ICD-10-CM | POA: Diagnosis not present

## 2024-04-26 DIAGNOSIS — I160 Hypertensive urgency: Secondary | ICD-10-CM | POA: Diagnosis not present

## 2024-04-26 DIAGNOSIS — N823 Fistula of vagina to large intestine: Secondary | ICD-10-CM

## 2024-04-26 LAB — BASIC METABOLIC PANEL WITH GFR
Anion gap: 14 (ref 5–15)
BUN: 14 mg/dL (ref 8–23)
CO2: 21 mmol/L — ABNORMAL LOW (ref 22–32)
Calcium: 10.1 mg/dL (ref 8.9–10.3)
Chloride: 106 mmol/L (ref 98–111)
Creatinine, Ser: 0.81 mg/dL (ref 0.44–1.00)
GFR, Estimated: >60 mL/min (ref >60)
Glucose, Bld: 114 mg/dL — ABNORMAL HIGH (ref 70–99)
Potassium: 3.3 mmol/L — ABNORMAL LOW (ref 3.5–5.1)
Sodium: 141 mmol/L (ref 135–145)

## 2024-04-26 LAB — FERRITIN: Ferritin: 41 ng/mL (ref 11–307)

## 2024-04-26 LAB — RETICULOCYTES
Immature Retic Fract: 12.6 % (ref 2.3–15.9)
RBC.: 4.47 MIL/uL (ref 3.87–5.11)
Retic Count, Absolute: 57.2 K/uL (ref 19.0–186.0)
Retic Ct Pct: 1.3 % (ref 0.4–3.1)

## 2024-04-26 LAB — CBC
HCT: 32.4 % — ABNORMAL LOW (ref 36.0–46.0)
Hemoglobin: 10.2 g/dL — ABNORMAL LOW (ref 12.0–15.0)
MCH: 22.6 pg — ABNORMAL LOW (ref 26.0–34.0)
MCHC: 31.5 g/dL (ref 30.0–36.0)
MCV: 71.7 fL — ABNORMAL LOW (ref 80.0–100.0)
Platelets: 236 K/uL (ref 150–400)
RBC: 4.52 MIL/uL (ref 3.87–5.11)
RDW: 15.3 % (ref 11.5–15.5)
WBC: 8 K/uL (ref 4.0–10.5)
nRBC: 0 % (ref 0.0–0.2)

## 2024-04-26 LAB — IRON AND TIBC
Iron: 47 ug/dL (ref 28–170)
Saturation Ratios: 13 % (ref 10.4–31.8)
TIBC: 365 ug/dL (ref 250–450)
UIBC: 318 ug/dL

## 2024-04-26 LAB — VITAMIN B12: Vitamin B-12: >4000 pg/mL — ABNORMAL HIGH (ref 180–914)

## 2024-04-26 LAB — TROPONIN I (HIGH SENSITIVITY)
Troponin I (High Sensitivity): 393 ng/L (ref <18)
Troponin I (High Sensitivity): 289 ng/L (ref <18)

## 2024-04-26 LAB — MAGNESIUM: Magnesium: 2.1 mg/dL (ref 1.7–2.4)

## 2024-04-26 LAB — FOLATE: Folate: >20 ng/mL (ref >5.9)

## 2024-04-26 MED ORDER — HYDROCODONE-ACETAMINOPHEN 5-325 MG PO TABS
1.0000 | ORAL_TABLET | Freq: Two times a day (BID) | ORAL | Status: DC | PRN
  Administered 2024-04-26 – 2024-05-01 (×4): 1 via ORAL
  Filled 2024-04-26 (×4): qty 1

## 2024-04-26 MED ORDER — CLONIDINE HCL 0.1 MG PO TABS
0.3000 mg | ORAL_TABLET | Freq: Three times a day (TID) | ORAL | Status: DC
  Administered 2024-04-26 – 2024-04-27 (×4): 0.3 mg via ORAL
  Filled 2024-04-26: qty 1
  Filled 2024-04-26 (×4): qty 3

## 2024-04-26 MED ORDER — POTASSIUM CHLORIDE CRYS ER 20 MEQ PO TBCR
40.0000 meq | EXTENDED_RELEASE_TABLET | ORAL | Status: AC
  Administered 2024-04-26: 40 meq via ORAL
  Filled 2024-04-26 (×2): qty 2

## 2024-04-26 MED ORDER — POLYETHYLENE GLYCOL 3350 17 G PO PACK
17.0000 g | PACK | Freq: Every day | ORAL | Status: DC
  Administered 2024-04-26 – 2024-05-04 (×7): 17 g via ORAL
  Filled 2024-04-26 (×9): qty 1

## 2024-04-26 MED ORDER — HYDRALAZINE HCL 25 MG PO TABS
25.0000 mg | ORAL_TABLET | Freq: Three times a day (TID) | ORAL | Status: DC
Start: 2024-04-26 — End: 2024-04-27
  Administered 2024-04-26 – 2024-04-27 (×3): 25 mg via ORAL
  Filled 2024-04-26 (×4): qty 1

## 2024-04-26 MED ORDER — FERROUS FUMARATE 324 (106 FE) MG PO TABS
324.0000 mg | ORAL_TABLET | Freq: Every day | ORAL | Status: DC
  Administered 2024-04-27 – 2024-05-04 (×7): 318 mg via ORAL
  Filled 2024-04-26 (×9): qty 3

## 2024-04-26 MED ORDER — LOSARTAN POTASSIUM 50 MG PO TABS
100.0000 mg | ORAL_TABLET | Freq: Every day | ORAL | Status: DC
  Administered 2024-04-26 – 2024-05-04 (×8): 100 mg via ORAL
  Filled 2024-04-26 (×9): qty 2

## 2024-04-26 NOTE — Plan of Care (Signed)

## 2024-04-26 NOTE — Progress Notes (Signed)
 PROGRESS NOTE  [name] FMW:[id number] DOB: 1934-02-06   PCP: Sim Emery CROME, MD  Patient is from: Lives with son.  Uses rolling walker at baseline.  DOA: 04/25/2024 LOS: 1  Chief complaints No chief complaint on file.    Brief Narrative / Interim history: 89 year old F with PMH of rectovaginal fistula, HTN, HLD, osteoarthritis and anemia presented to ED with bleeding.  Noted blood trickling down her legs on Friday.  Bleeding subsided but she noted blood when she wiped after urination on Saturday, and she came to ED.  Not on anticoagulation, antiplatelet or NSAID.  Denies history of hemorrhoids or diverticulosis.  Denies trauma fall.  In ED, hypertensive to 140/112.  Slightly tachycardic to 110.  Hgb 12.1 (10.6 in 2022).  UA with small Hgb and 6-10 RBC/hpf.  Troponin elevated to 269 and 366.  BMP without significant finding.  EKG sinus rhythm with prolonged PR but no acute ischemic finding.  CT abdomen and pelvis showed 2.3 cm endometrial wall thickening, fibroid uterus and moderate stool in colon.  Cardiology consulted about elevated troponin and BP recommended trending troponin, TTE and consulting again if abnormal finding on TTE.   The next day, patient has not had further bleeding episode but hemoglobin dropped to 10.2.  Blood pressure normalized.  TTE pending.  Subjective: Seen and examined earlier this morning.  No major events overnight or this morning.  No complaints other than pain in the hand from osteoarthritis.  Denies nausea, vomiting, abdominal pain or further bleeding.  Denies chest pain or dyspnea.   Assessment and plan: Hypertensive urgency: SBP as high as 240 in ED.  Now normotensive. -Continue home clonidine , Cozaar, hydralazine - Follow echocardiogram.   Bleeding: Unclear if this was GI, urinary or genital.  Blood noted during exam by EDP but source was not clear.  CT abdomen and pelvis with endometrial wall thickening and fibroid uterus.  Hemoglobin  dropped from 12.1-10.2 but her hemoglobin was 10.6 about 3 years ago.  UA with small Hgb and 6-10 RBC on hpf.  No further bleeding here.  Not on anticoagulation, antiplatelets or NSAIDs. -Normalize BP as above -Outpatient follow-up with gynecology for endometrial wall thickening -Monitor CBC.  GI consult if further drop. -Check anemia panel -Bowel regimen   Elevated troponin/type II MI: Likely due to uncontrolled hypertension.  Patient without cardiopulmonary symptoms.  EKG without acute ischemic finding.  Troponin trended from 269 and peaked at 393. -Normalize BP as above -Follow echocardiogram -Cardiology consult if echocardiogram abnormal   Microcytic anemia: Hgb 12.1 on admission and down to 10.2 this morning.  Unknown baseline but 10.63 years ago -Management as above   Endometrial wall thickening/uterine fibroid -Outpatient follow-up with gynecology  Osteoarthritis/generalized weakness -Tylenol  as needed - PT/OT eval  Hypokalemia -Monitor replenish K and Mg as appropriate   There is no height or weight on file to calculate BMI.          DVT prophylaxis:  enoxaparin (LOVENOX) injection 40 mg Start: 04/26/24 1000  Code Status: DNR Family Communication: None at bedside Level of care: Telemetry Medical Status is: Inpatient Remains inpatient appropriate because: Elevated troponin, bleeding   Final disposition: Home   55 minutes with more than 50% spent in reviewing records, counseling patient/family and coordinating care.  Consultants:  Cardiology  Procedures: None  Microbiology summarized: None  Objective: Vitals:   04/26/24 0728 04/26/24 0729 04/26/24 1050 04/26/24 1120  BP:  128/61 (!) 114/50   Pulse:  68 79   Resp:  15 16   Temp: 98.8 F (37.1 C)   99.2 F (37.3 C)  TempSrc: Oral   Oral  SpO2:  100% 100%     Examination:  GENERAL: No apparent distress.  Nontoxic. HEENT: MMM.  Vision and hearing grossly intact.  NECK: Supple.  No apparent  JVD.  RESP:  No IWOB.  Fair aeration bilaterally. CVS:  RRR. Heart sounds normal.  ABD/GI/GU: BS+. Abd soft, NTND.  MSK/EXT:  Moves extremities. No apparent deformity. No edema.  SKIN: no apparent skin lesion or wound NEURO: AA.  Oriented appropriately.  No apparent focal neuro deficit. PSYCH: Calm. Normal affect.   Sch Meds:  Scheduled Meds:  cloNIDine   0.3 mg Oral Q8H   enoxaparin (LOVENOX) injection  40 mg Subcutaneous Q24H   Ferrous Fumarate  318 mg of iron Oral Q breakfast   hydrALAZINE  25 mg Oral Q8H   losartan  100 mg Oral Daily   polyethylene glycol  17 g Oral Daily   potassium chloride  40 mEq Oral Q3H   Continuous Infusions: PRN Meds:.acetaminophen  **OR** acetaminophen , bisacodyl, HYDROcodone-acetaminophen , ondansetron **OR** ondansetron (ZOFRAN) IV, senna-docusate  Antimicrobials: Anti-infectives (From admission, onward)    None        I have personally reviewed the following labs and images: CBC: Recent Labs  Lab 04/25/24 1419 04/26/24 0324  WBC 6.9 8.0  HGB 12.1 10.2*  HCT 39.5 32.4*  MCV 74.4* 71.7*  PLT 252 236   BMP &GFR Recent Labs  Lab 04/25/24 1507 04/26/24 0324 04/26/24 0521  NA 139 141  --   K 3.6 3.3*  --   CL 107 106  --   CO2 18* 21*  --   GLUCOSE 99 114*  --   BUN 16 14  --   CREATININE 0.79 0.81  --   CALCIUM 10.5* 10.1  --   MG  --   --  2.1   CrCl cannot be calculated (Unknown ideal weight.). Liver & Pancreas: No results for input(s): AST, ALT, ALKPHOS, BILITOT, PROT, ALBUMIN in the last 168 hours. No results for input(s): LIPASE, AMYLASE in the last 168 hours. No results for input(s): AMMONIA in the last 168 hours. Diabetic: No results for input(s): HGBA1C in the last 72 hours. No results for input(s): GLUCAP in the last 168 hours. Cardiac Enzymes: No results for input(s): CKTOTAL, CKMB, CKMBINDEX, TROPONINI in the last 168 hours. No results for input(s): PROBNP in the last 8760  hours. Coagulation Profile: No results for input(s): INR, PROTIME in the last 168 hours. Thyroid Function Tests: No results for input(s): TSH, T4TOTAL, FREET4, T3FREE, THYROIDAB in the last 72 hours. Lipid Profile: No results for input(s): CHOL, HDL, LDLCALC, TRIG, CHOLHDL, LDLDIRECT in the last 72 hours. Anemia Panel: No results for input(s): VITAMINB12, FOLATE, FERRITIN, TIBC, IRON, RETICCTPCT in the last 72 hours. Urine analysis:    Component Value Date/Time   COLORURINE YELLOW 04/25/2024 1348   APPEARANCEUR CLEAR 04/25/2024 1348   LABSPEC 1.011 04/25/2024 1348   PHURINE 7.0 04/25/2024 1348   GLUCOSEU NEGATIVE 04/25/2024 1348   HGBUR SMALL (A) 04/25/2024 1348   BILIRUBINUR NEGATIVE 04/25/2024 1348   KETONESUR NEGATIVE 04/25/2024 1348   PROTEINUR 100 (A) 04/25/2024 1348   NITRITE NEGATIVE 04/25/2024 1348   LEUKOCYTESUR NEGATIVE 04/25/2024 1348   Sepsis Labs: Invalid input(s): PROCALCITONIN, LACTICIDVEN  Microbiology: No results found for this or any previous visit (from the past 240 hours).  Radiology Studies: CT ABDOMEN PELVIS W CONTRAST Result Date: 04/25/2024 CLINICAL DATA:  Suprapubic pain with vaginal versus rectal bleeding EXAM: CT ABDOMEN AND PELVIS WITH CONTRAST TECHNIQUE: Multidetector CT imaging of the abdomen and pelvis was performed using the standard protocol following bolus administration of intravenous contrast. RADIATION DOSE REDUCTION: This exam was performed according to the departmental dose-optimization program which includes automated exposure control, adjustment of the mA and/or kV according to patient size and/or use of iterative reconstruction technique. CONTRAST:  75mL OMNIPAQUE IOHEXOL 350 MG/ML SOLN COMPARISON:  None Available. FINDINGS: Lower chest: No acute pleural or parenchymal lung disease. Hepatobiliary: Cholecystectomy. Intrahepatic and extrahepatic biliary duct dilation likely related to prior  cholecystectomy, with common bile duct measuring up to 13 mm. No focal liver abnormalities. Pancreas: Unremarkable. No pancreatic ductal dilatation or surrounding inflammatory changes. Spleen: Normal in size without focal abnormality. Adrenals/Urinary Tract: Multiple bilateral renal cortical cysts do not require specific imaging follow-up. No urinary tract calculi or obstructive uropathy. Bladder is unremarkable with no wall thickening or filling defect. The adrenals are normal. Stomach/Bowel: No bowel obstruction or ileus. Moderate stool throughout the colon. No bowel wall thickening or inflammatory change. The appendix, if still present, is not well visualized. Vascular/Lymphatic: Aortic atherosclerosis. No enlarged abdominal or pelvic lymph nodes. Reproductive: Multiple calcified and noncalcified uterine fibroids. Heterogeneous endometrial thickening measuring up to 2.3 cm. No adnexal masses. Other: No free fluid or free intraperitoneal gas. No abdominal wall hernia. Musculoskeletal: Severe bilateral hip osteoarthritis. No acute displaced fractures. Diffuse thoracolumbar spondylosis and facet hypertrophy. Reconstructed images demonstrate no additional findings. IMPRESSION: 1. Abnormal endometrial thickening measuring up to 2.3 cm. Underlying endometrial carcinoma is a concern given history of possible vaginal bleeding. Endometrial sampling is recommended. 2. Moderate stool throughout the colon consistent with constipation. No bowel obstruction or ileus. 3. Cholecystectomy, with postsurgical dilatation of the biliary tree. 4. Fibroid uterus. 5. Severe bilateral hip osteoarthritis. 6.  Aortic Atherosclerosis (ICD10-I70.0). Electronically Signed   By: Ozell Daring M.D.   On: 04/25/2024 21:10      Peyton Rossner T. Yvonnie Schinke [facility]  If 7PM-7AM, please contact night-coverage [email or web address] 04/26/2024, 12:22 PM

## 2024-04-27 ENCOUNTER — Inpatient Hospital Stay (HOSPITAL_COMMUNITY): Payer: Medicare (Managed Care)

## 2024-04-27 DIAGNOSIS — I160 Hypertensive urgency: Secondary | ICD-10-CM

## 2024-04-27 DIAGNOSIS — R7989 Other specified abnormal findings of blood chemistry: Secondary | ICD-10-CM | POA: Diagnosis not present

## 2024-04-27 DIAGNOSIS — R310 Gross hematuria: Secondary | ICD-10-CM | POA: Diagnosis not present

## 2024-04-27 DIAGNOSIS — R9389 Abnormal findings on diagnostic imaging of other specified body structures: Secondary | ICD-10-CM | POA: Diagnosis not present

## 2024-04-27 LAB — RENAL FUNCTION PANEL
Albumin: 3.3 g/dL — ABNORMAL LOW (ref 3.5–5.0)
Anion gap: 9 (ref 5–15)
BUN: 22 mg/dL (ref 8–23)
CO2: 22 mmol/L (ref 22–32)
Calcium: 9.6 mg/dL (ref 8.9–10.3)
Chloride: 108 mmol/L (ref 98–111)
Creatinine, Ser: 1.08 mg/dL — ABNORMAL HIGH (ref 0.44–1.00)
GFR, Estimated: 49 mL/min — ABNORMAL LOW (ref >60)
Glucose, Bld: 103 mg/dL — ABNORMAL HIGH (ref 70–99)
Phosphorus: 3.2 mg/dL (ref 2.5–4.6)
Potassium: 4.3 mmol/L (ref 3.5–5.1)
Sodium: 139 mmol/L (ref 135–145)

## 2024-04-27 LAB — ECHOCARDIOGRAM COMPLETE
Area-P 1/2: 4.49 cm2
S' Lateral: 1.9 cm

## 2024-04-27 LAB — MAGNESIUM: Magnesium: 2.1 mg/dL (ref 1.7–2.4)

## 2024-04-27 LAB — CBC
HCT: 30.2 % — ABNORMAL LOW (ref 36.0–46.0)
Hemoglobin: 9.7 g/dL — ABNORMAL LOW (ref 12.0–15.0)
MCH: 22.9 pg — ABNORMAL LOW (ref 26.0–34.0)
MCHC: 32.1 g/dL (ref 30.0–36.0)
MCV: 71.4 fL — ABNORMAL LOW (ref 80.0–100.0)
Platelets: 223 K/uL (ref 150–400)
RBC: 4.23 MIL/uL (ref 3.87–5.11)
RDW: 15.5 % (ref 11.5–15.5)
WBC: 6.2 K/uL (ref 4.0–10.5)
nRBC: 0 % (ref 0.0–0.2)

## 2024-04-27 MED ORDER — DICLOFENAC SODIUM 1 % EX GEL
2.0000 g | Freq: Four times a day (QID) | CUTANEOUS | Status: DC
  Administered 2024-04-27 – 2024-05-04 (×28): 2 g via TOPICAL
  Filled 2024-04-27 (×2): qty 100

## 2024-04-27 MED ORDER — CLONIDINE HCL 0.1 MG PO TABS
0.2000 mg | ORAL_TABLET | Freq: Three times a day (TID) | ORAL | Status: DC
Start: 2024-04-27 — End: 2024-05-04
  Administered 2024-04-27 – 2024-05-04 (×17): 0.2 mg via ORAL
  Filled 2024-04-27 (×22): qty 2

## 2024-04-27 MED ORDER — HYDRALAZINE HCL 50 MG PO TABS
50.0000 mg | ORAL_TABLET | Freq: Three times a day (TID) | ORAL | Status: DC
  Administered 2024-04-27: 50 mg via ORAL
  Filled 2024-04-27: qty 1

## 2024-04-27 NOTE — Progress Notes (Signed)
 PROGRESS NOTE  [name] FMW:[id number] DOB: 04-04-1934   PCP: Sim Emery CROME, MD  Patient is from: Lives with son.  Uses rolling walker at baseline.  DOA: 04/25/2024 LOS: 2  Chief complaints No chief complaint on file.    Brief Narrative / Interim history: 89 year old F with PMH of rectovaginal fistula, HTN, HLD, osteoarthritis and anemia presented to ED with bleeding.  Noted blood trickling down her legs on Friday.  Bleeding subsided but she noted blood when she wiped after urination on Saturday, and she came to ED.  Not on anticoagulation, antiplatelet or NSAID.  Denies history of hemorrhoids or diverticulosis.  Denies trauma fall.  In ED, hypertensive to 140/112.  Slightly tachycardic to 110.  Hgb 12.1 (10.6 in 2022).  UA with small Hgb and 6-10 RBC/hpf.  Troponin elevated to 269 and 366.  BMP without significant finding.  EKG sinus rhythm with prolonged PR but no acute ischemic finding.  CT abdomen and pelvis showed 2.3 cm endometrial wall thickening, fibroid uterus and moderate stool in colon.  Cardiology consulted about elevated troponin and BP recommended trending troponin, TTE and consulting again if abnormal finding on TTE.   The next day, patient has not had further bleeding episode but hemoglobin dropped to 9.7.  No report of further GI bleed here.  Blood pressure normalized.  TTE pending.  Subjective: Seen and examined earlier this morning.  No major events overnight or this morning.  Complaining of severe bilateral hand pain from osteoarthritis.  Denies chest pain, shortness of breath or UTI symptoms.  She says she had bowel movement this morning but did not look if there is blood in the stool.   Assessment and plan: Hypertensive urgency: SBP as high as 240 in ED. soft BP today. -Decrease clonidine  to 0.2 mg 3 times daily -Continue losartan 100 mg daily -Discontinue hydralazine.  Has not seemed to take this at home. -Added holding parameters -Follow  echocardiogram.   Bleeding: Unclear if this was GI, urinary or genital.  Blood noted during exam by EDP but source was not clear.  CT abdomen and pelvis with endometrial wall thickening and fibroid uterus.  Hemoglobin dropped from 12.1-9.7 but her Hgb was 10.6 about 3 years ago.  UA with small Hgb and 6-10 RBC on hpf.  No further bleeding here.  Not on anticoagulation, antiplatelets or NSAIDs.  No report of overt bleeding since admission.  Mild iron deficiency on anemia panel. -Normalize BP as above -Outpatient follow-up with gynecology for endometrial wall thickening -Continue MiraLAX for possible constipation - Monitor CBC.    Elevated troponin/type II MI: Likely due to uncontrolled hypertension.  Patient without cardiopulmonary symptoms.  EKG without acute ischemic finding.  Troponin trended from 269 and peaked at 393. -Normalize BP as above -Follow echocardiogram -Cardiology consult based on TTE   Microcytic iron deficiency anemia: Hgb 12.1 on admission.  Unknown baseline but 10.6 about 3 years ago Recent Labs    04/25/24 1419 04/26/24 0324 04/27/24 0449  HGB 12.1 10.2* 9.7*  -Management as above -Will start p.o. iron on discharge.   Endometrial wall thickening/uterine fibroid -Outpatient follow-up with gynecology  Osteoarthritis/generalized weakness: Significant pain in both hands. -Tylenol  as needed -Added Voltaren  gel -PT/OT eval  Hypokalemia -Monitor replenish K and Mg as appropriate   There is no height or weight on file to calculate BMI.          DVT prophylaxis:  enoxaparin (LOVENOX) injection 40 mg Start: 04/26/24 1000  Code Status: DNR  Family Communication: None at bedside Level of care: Telemetry Medical Status is: Inpatient Remains inpatient appropriate because: Elevated troponin, bleeding   Final disposition: Home   35 minutes with more than 50% spent in reviewing records, counseling patient/family and coordinating care.  Consultants:   Cardiology  Procedures: None  Microbiology summarized: None  Objective: Vitals:   04/27/24 0018 04/27/24 0456 04/27/24 0758 04/27/24 1215  BP: (!) 127/52 (!) 158/67 (!) 154/69 (!) 126/47  Pulse: 64 64 77 71  Resp:   16 16  Temp: 97.9 F (36.6 C) 97.9 F (36.6 C) 99.1 F (37.3 C) 98.5 F (36.9 C)  TempSrc:   Oral Oral  SpO2: 100% 100% 95% 99%    Examination:  GENERAL: No apparent distress.  Nontoxic. HEENT: MMM.  Vision and hearing grossly intact.  NECK: Supple.  No apparent JVD.  RESP:  No IWOB.  Fair aeration bilaterally. CVS:  RRR. Heart sounds normal.  ABD/GI/GU: BS+. Abd soft, NTND.  MSK/EXT:  Moves extremities. No apparent deformity. No edema.  SKIN: no apparent skin lesion or wound NEURO: AA.  Oriented appropriately.  No apparent focal neuro deficit. PSYCH: Calm. Normal affect.   Sch Meds:  Scheduled Meds:  cloNIDine   0.3 mg Oral Q8H   diclofenac  Sodium  2 g Topical QID   enoxaparin (LOVENOX) injection  40 mg Subcutaneous Q24H   Ferrous Fumarate  318 mg of iron Oral Q breakfast   hydrALAZINE  50 mg Oral Q8H   losartan  100 mg Oral Daily   polyethylene glycol  17 g Oral Daily   Continuous Infusions: PRN Meds:.acetaminophen  **OR** acetaminophen , bisacodyl, HYDROcodone-acetaminophen , ondansetron **OR** ondansetron (ZOFRAN) IV, senna-docusate  Antimicrobials: Anti-infectives (From admission, onward)    None        I have personally reviewed the following labs and images: CBC: Recent Labs  Lab 04/25/24 1419 04/26/24 0324 04/27/24 0449  WBC 6.9 8.0 6.2  HGB 12.1 10.2* 9.7*  HCT 39.5 32.4* 30.2*  MCV 74.4* 71.7* 71.4*  PLT 252 236 223   BMP &GFR Recent Labs  Lab 04/25/24 1507 04/26/24 0324 04/26/24 0521 04/27/24 0449  NA 139 141  --  139  K 3.6 3.3*  --  4.3  CL 107 106  --  108  CO2 18* 21*  --  22  GLUCOSE 99 114*  --  103*  BUN 16 14  --  22  CREATININE 0.79 0.81  --  1.08*  CALCIUM 10.5* 10.1  --  9.6  MG  --   --  2.1 2.1   PHOS  --   --   --  3.2   CrCl cannot be calculated (Unknown ideal weight.). Liver & Pancreas: Recent Labs  Lab 04/27/24 0449  ALBUMIN 3.3*   No results for input(s): LIPASE, AMYLASE in the last 168 hours. No results for input(s): AMMONIA in the last 168 hours. Diabetic: No results for input(s): HGBA1C in the last 72 hours. No results for input(s): GLUCAP in the last 168 hours. Cardiac Enzymes: No results for input(s): CKTOTAL, CKMB, CKMBINDEX, TROPONINI in the last 168 hours. No results for input(s): PROBNP in the last 8760 hours. Coagulation Profile: No results for input(s): INR, PROTIME in the last 168 hours. Thyroid Function Tests: No results for input(s): TSH, T4TOTAL, FREET4, T3FREE, THYROIDAB in the last 72 hours. Lipid Profile: No results for input(s): CHOL, HDL, LDLCALC, TRIG, CHOLHDL, LDLDIRECT in the last 72 hours. Anemia Panel: Recent Labs    04/26/24 0324 04/26/24 1533  VITAMINB12  --  >  4,000*  FOLATE >20.0  --   FERRITIN 41  --   TIBC 365  --   IRON 47  --   RETICCTPCT 1.3  --    Urine analysis:    Component Value Date/Time   COLORURINE YELLOW 04/25/2024 1348   APPEARANCEUR CLEAR 04/25/2024 1348   LABSPEC 1.011 04/25/2024 1348   PHURINE 7.0 04/25/2024 1348   GLUCOSEU NEGATIVE 04/25/2024 1348   HGBUR SMALL (A) 04/25/2024 1348   BILIRUBINUR NEGATIVE 04/25/2024 1348   KETONESUR NEGATIVE 04/25/2024 1348   PROTEINUR 100 (A) 04/25/2024 1348   NITRITE NEGATIVE 04/25/2024 1348   LEUKOCYTESUR NEGATIVE 04/25/2024 1348   Sepsis Labs: Invalid input(s): PROCALCITONIN, LACTICIDVEN  Microbiology: No results found for this or any previous visit (from the past 240 hours).  Radiology Studies: No results found.     Raphel Stickles T. Awilda Covin [facility]  If 7PM-7AM, please contact night-coverage [email or web address] 04/27/2024, 1:34 PM

## 2024-04-27 NOTE — Plan of Care (Signed)
 Patient A&O X3, medications tolerated well.   Problem: Education: Goal: Knowledge of General Education information will improve Description: Including pain rating scale, medication(s)/side effects and non-pharmacologic comfort measures Outcome: Progressing   Problem: Health Behavior/Discharge Planning: Goal: Ability to manage health-related needs will improve Outcome: Progressing   Problem: Clinical Measurements: Goal: Ability to maintain clinical measurements within normal limits will improve Outcome: Progressing   Problem: Activity: Goal: Risk for activity intolerance will decrease Outcome: Progressing   Problem: Nutrition: Goal: Adequate nutrition will be maintained Outcome: Progressing   Problem: Coping: Goal: Level of anxiety will decrease Outcome: Progressing   Problem: Pain Managment: Goal: General experience of comfort will improve and/or be controlled Outcome: Progressing   Problem: Safety: Goal: Ability to remain free from injury will improve Outcome: Progressing

## 2024-04-27 NOTE — Plan of Care (Signed)

## 2024-04-27 NOTE — Evaluation (Signed)
 Occupational Therapy Evaluation Patient Details Name: [name] MRN: [id number] DOB: [birth year] Today's Date: 04/27/2024   History of Present Illness   89 y.o. female who presented to the ED 10/18 for evaluation of hematuria.  HR 90-110s, SBP 170-230s, with up trending troponin. CT abdomen and pelvis with endometrial wall thickening and fibroid uterus. Admitted for treatment of HTN urgency and bleeding. EFY:lwrnwumnoozi hypertension, hyperlipidemia, osteoarthritis, rectovaginal fistula, anemia and insomnia     Clinical Impressions PTA, pt lives with son, reports typically ambulatory with Rollator and able to manage ADLs aside from showering tasks (niece assists when needed). Pt reports significant OA in B knees limiting B knee flexion and requiring higher surface heights to transfer successfully. Pt requires Min A to stand from elevated bed and Mod A x 2 to stand from lower toilet d/t reason mentioned above. Once up, pt able to mobilize with Min A using RW. Pt requires no more than Min A for ADLs assessed today; may benefit from AE education in next session. Pt reports her son uses a walker as well and unable to provide much assistance. Due to this, recommend consideration of postacute rehab < 3 hours therapy per day though pt hopeful to DC directly home.     If plan is discharge home, recommend the following:   A little help with walking and/or transfers;A little help with bathing/dressing/bathroom;Assistance with cooking/housework     Functional Status Assessment   Patient has had a recent decline in their functional status and demonstrates the ability to make significant improvements in function in a reasonable and predictable amount of time.     Equipment Recommendations   Wheelchair (measurements OT);Wheelchair cushion (measurements OT) (if does not already have)     Recommendations for Other Services         Precautions/Restrictions    Precautions Precautions: Fall Precaution/Restrictions Comments: impaired B knee flexion Restrictions Weight Bearing Restrictions Per Provider Order: No     Mobility Bed Mobility Overal bed mobility: Needs Assistance Bed Mobility: Supine to Sit     Supine to sit: Contact guard, HOB elevated     General bed mobility comments: increased time. HOB to 30*    Transfers Overall transfer level: Needs assistance Equipment used: Rolling walker (2 wheels) Transfers: Sit to/from Stand Sit to Stand: Min assist, Mod assist, +2 physical assistance, +2 safety/equipment, From elevated surface           General transfer comment: Min A from elevated bed height, Mod A x 2 from lower toilet height      Balance Overall balance assessment: Needs assistance Sitting-balance support: No upper extremity supported, Feet supported Sitting balance-Leahy Scale: Fair     Standing balance support: Bilateral upper extremity supported, During functional activity Standing balance-Leahy Scale: Poor                             ADL either performed or assessed with clinical judgement   ADL Overall ADL's : Needs assistance/impaired Eating/Feeding: Set up   Grooming: Set up;Sitting;Wash/dry hands   Upper Body Bathing: Sitting;Set up   Lower Body Bathing: Minimal assistance;Sit to/from stand Lower Body Bathing Details (indicate cue type and reason): assist for balance/clothing mgmt with pt washing peri region standing with RW Upper Body Dressing : Set up;Sitting   Lower Body Dressing: Minimal assistance;Sitting/lateral leans;Sit to/from stand Lower Body Dressing Details (indicate cue type and reason): able to demo ability to reach to B feet seated in  recliner. Anticipate some assist donning over waist/balance. Introduced idea of AE for LB ADLs - would benefit from further education Toilet Transfer: Minimal assistance;Moderate assistance;+2 for physical assistance;+2 for  safety/equipment;Ambulation;Rolling walker (2 wheels) Toilet Transfer Details (indicate cue type and reason): Min A intermittently for RW navigation and cues for RW use to walk to bathroom. Mod A x 2 needed to stand from regular toilet height due to poor ability to bend B knees and pt feet sliding in attempts to stand Toileting- Clothing Manipulation and Hygiene: Minimal assistance;Sit to/from stand;Sitting/lateral lean       Functional mobility during ADLs: Minimal assistance;Rolling walker (2 wheels)       Vision Baseline Vision/History: 1 Wears glasses Ability to See in Adequate Light: 0 Adequate Patient Visual Report: No change from baseline Vision Assessment?: Wears glasses for reading;No apparent visual deficits     Perception         Praxis         Pertinent Vitals/Pain Pain Assessment Pain Assessment: Faces Faces Pain Scale: Hurts a little bit Pain Location: B hands due to arthritis Pain Descriptors / Indicators: Sore Pain Intervention(s): Monitored during session, Heat applied     Extremity/Trunk Assessment Upper Extremity Assessment Upper Extremity Assessment: Generalized weakness;Right hand dominant;RUE deficits/detail;LUE deficits/detail RUE Deficits / Details: arthritis in B hands impacting fine motor and grasp RUE Coordination: decreased fine motor LUE Deficits / Details: arthritis in B hands impacting fine motor and grasp LUE Coordination: decreased fine motor   Lower Extremity Assessment Lower Extremity Assessment: Defer to PT evaluation (significant impairment in ability to flex B knees)   Cervical / Trunk Assessment Cervical / Trunk Assessment: Kyphotic   Communication Communication Communication: No apparent difficulties   Cognition Arousal: Alert Behavior During Therapy: WFL for tasks assessed/performed Cognition: No apparent impairments                               Following commands: Intact       Cueing  General  Comments   Cueing Techniques: Verbal cues      Exercises     Shoulder Instructions      Home Living Family/patient expects to be discharged to:: Private residence Living Arrangements: Children Available Help at Discharge: Family Type of Home: House Home Access: Ramped entrance     Home Layout: One level         Bathroom Toilet: Standard (with toilet riser)     Home Equipment: Rollator (4 wheels);Shower seat;Toilet riser;Grab bars - toilet;Grab bars - tub/shower   Additional Comments: Son uses a walker too per pt; cant help with showering or heavy IADLs      Prior Functioning/Environment Prior Level of Function : Needs assist             Mobility Comments: Rollator for mobility. Can only stand from higher surfaces due to OA In B knees and impaired B knee flexion. Has multiple cushions in chair in living room, high bed, high toilet and higher shower chair ADLs Comments: Able to dress self, niece comes by to assist with showers. Pt reports she does what she can for cooking/cleaning- can make simple meals like oatmel, etc.    OT Problem List: Decreased strength;Decreased activity tolerance;Impaired balance (sitting and/or standing);Decreased coordination   OT Treatment/Interventions: Self-care/ADL training;Therapeutic exercise;Energy conservation;DME and/or AE instruction;Therapeutic activities;Patient/family education;Balance training      OT Goals(Current goals can be found in the care plan section)   Acute  Rehab OT Goals Patient Stated Goal: not have any falls, rather go home than rehab OT Goal Formulation: With patient Time For Goal Achievement: 05/11/24 Potential to Achieve Goals: Good ADL Goals Pt Will Perform Lower Body Dressing: with modified independence;with adaptive equipment;sit to/from stand;sitting/lateral leans Pt Will Transfer to Toilet: with contact guard assist;ambulating Additional ADL Goal #1: Pt to demo ability to stand > 5 min during  ADLs/mobility without need for seated rest break   OT Frequency:  Min 2X/week    Co-evaluation              AM-PAC OT 6 Clicks Daily Activity     Outcome Measure Help from another person eating meals?: A Little Help from another person taking care of personal grooming?: A Little Help from another person toileting, which includes using toliet, bedpan, or urinal?: A Little Help from another person bathing (including washing, rinsing, drying)?: A Little Help from another person to put on and taking off regular upper body clothing?: A Little Help from another person to put on and taking off regular lower body clothing?: A Little 6 Click Score: 18   End of Session Equipment Utilized During Treatment: Gait belt;Rolling walker (2 wheels) Nurse Communication: Mobility status  Activity Tolerance: Patient tolerated treatment well Patient left: in chair;with call bell/phone within reach;with chair alarm set  OT Visit Diagnosis: Unsteadiness on feet (R26.81);Other abnormalities of gait and mobility (R26.89);Muscle weakness (generalized) (M62.81)                Time: 9259-9194 OT Time Calculation (min): 25 min Charges:  OT General Charges $OT Visit: 1 Visit OT Evaluation $OT Eval Moderate Complexity: 1 Mod OT Treatments $Self Care/Home Management : 8-22 mins  Mliss NOVAK, OTR/L Acute Rehab Services Office: [phone]   Mliss Fish 04/27/2024, 8:53 AM

## 2024-04-27 NOTE — Progress Notes (Signed)
 Transition of Care [facility]) - Inpatient Brief Assessment   Patient Details  Name: [name] MRN: [id number] Date of Birth: [birth year]  Transition of Care [facility] CM/SW Contact:    Rosaline JONELLE Joe, RN Phone Number: 04/27/2024, 2:15 PM   Clinical Narrative: CM met with the patient, sister, niece at the bedside to discuss IP Care management needs.  The patient lives at home with her son, [name] who is unable to care for her at the home per family in home.  The patient is waiting to be evaluated by PT and SNf placement is recommended.  The patient states that the son at the home drinks ETOH and many times is not available at the home to provide the 24 hour supervision.  The patient was agreeable to short-term rehabilitation for placement - pending PT evaluation.  Niece states that the patient has Medicaid that is not listed in the chart.  The patient's niece was provided with Personal Care paperwork to take to the PCP to fill out to assist with personal care services at at later date since patient likely needs SNF placement.  Patient's family states that patient's HPOA is her brother, [name] - [phone].  DME at the home includes Rolator and shower seat.  SNF workup will be started once patient is seen by PT - notes pending at this time.   Transition of Care Asessment: Insurance and Status: (P) Insurance coverage has been reviewed Patient has primary care physician: (P) Yes Home environment has been reviewed: (P) from home with son Prior level of function:: (P) Rolator Prior/Current Home Services: (P) No current home services (DME at the home that includes Rolator, shower seat) Social Drivers of Health Review: (P) SDOH reviewed needs interventions Readmission risk has been reviewed: (P) Yes Transition of care needs: (P) transition of care needs identified, TOC will continue to follow

## 2024-04-27 NOTE — Progress Notes (Signed)
 PT Cancellation Note  Patient Details Name: [name] MRN: [id number] DOB: 30-Nov-1933   Cancelled Treatment:    Reason Eval/Treat Not Completed: (P) Patient at procedure or test/unavailable Pt is having ECHO done. PT will follow back later.   [name] B. Fleeta Lapidus PT, DPT Acute Rehabilitation Services Please use secure chat or  Call Office [phone]    [name] Fleeta [facility] 04/27/2024, 9:50 AM

## 2024-04-27 NOTE — Evaluation (Signed)
 Physical Therapy Evaluation Patient Details Name: [name] MRN: [id number] DOB: 07-21-1933 Today's Date: 04/27/2024  History of Present Illness  89 y.o. female who presented to the ED 10/18 for evaluation of hematuria.  HR 90-110s, SBP 170-230s, with up trending troponin. CT abdomen and pelvis with endometrial wall thickening and fibroid uterus. Admitted for treatment of HTN urgency and bleeding. EFY:lwrnwumnoozi hypertension, hyperlipidemia, osteoarthritis, rectovaginal fistula, anemia and insomnia  Clinical Impression  Case Manager just leaving room after talking to the patient and her family. Pt is upset with OT recommendation of SNF level rehab at discharge. Pt is limited in safe mobility by decreased knee flexion, increased trunk flexion, generalized weakness, and decreased balance. Pt is contact guard for bed mobility. Pt requires bed elevation and modA from therapist to pull pt up over her flexed legs while blocking them to keep them from sliding once up in standing pt is able to reach to recliner for support. Pt is min A for ambulation. Despite pt's misgivings PT believes patient will benefit from continued inpatient follow up therapy, <3 hours/day         If plan is discharge home, recommend the following: Two people to help with walking and/or transfers;Two people to help with bathing/dressing/bathroom;Assistance with cooking/housework;Direct supervision/assist for medications management;Direct supervision/assist for financial management;Assist for transportation;Help with stairs or ramp for entrance;Supervision due to cognitive status   Can travel by private vehicle   No    Equipment Recommendations Hospital bed;Wheelchair (measurements PT);Wheelchair cushion (measurements PT)     Functional Status Assessment Patient has had a recent decline in their functional status and demonstrates the ability to make significant improvements in function in a reasonable and  predictable amount of time.     Precautions / Restrictions Precautions Precautions: Fall Precaution/Restrictions Comments: impaired B knee flexion Restrictions Weight Bearing Restrictions Per Provider Order: No      Mobility  Bed Mobility Overal bed mobility: Needs Assistance Bed Mobility: Supine to Sit, Sit to Supine     Supine to sit: Contact guard, HOB elevated, Used rails Sit to supine: Contact guard assist   General bed mobility comments: increased time and use of bed rail to come to EoB    Transfers Overall transfer level: Needs assistance Equipment used: Rolling walker (2 wheels) Transfers: Sit to/from Stand Sit to Stand: Mod assist, From elevated surface           General transfer comment: Min A from elevated bed height, Mod A x 2 from lower toilet height    Ambulation/Gait Ambulation/Gait assistance: Min assist Gait Distance (Feet): 14 Feet Assistive device: Rolling walker (2 wheels) Gait Pattern/deviations: Step-through pattern Gait velocity: slowed Gait velocity interpretation: <1.31 ft/sec, indicative of household ambulator   General Gait Details: once up pt moves with very slowed gait and almost entirely flexed posture, pt is unable to come to fully upright. Stands at sink for periare, she is unable to perform herself in standing.         Balance Overall balance assessment: Needs assistance Sitting-balance support: No upper extremity supported, Feet supported Sitting balance-Leahy Scale: Fair     Standing balance support: Bilateral upper extremity supported, During functional activity Standing balance-Leahy Scale: Poor                               Pertinent Vitals/Pain Pain Assessment Pain Assessment: Faces Faces Pain Scale: Hurts a little bit Pain Location: B hands due to arthritis Pain  Descriptors / Indicators: Sore Pain Intervention(s): Limited activity within patient's tolerance, Monitored during session, Repositioned     Home Living Family/patient expects to be discharged to:: Private residence Living Arrangements: Children Available Help at Discharge: Family Type of Home: House Home Access: Ramped entrance       Home Layout: One level Home Equipment: Rollator (4 wheels);Shower seat;Toilet riser;Grab bars - toilet;Grab bars - tub/shower Additional Comments: Son uses a walker too per pt; cant help with showering or heavy IADLs    Prior Function Prior Level of Function : Needs assist             Mobility Comments: Rollator for mobility. Can only stand from higher surfaces due to OA In B knees and impaired B knee flexion. Has multiple cushions in chair in living room, high bed, high toilet and higher shower chair ADLs Comments: Able to dress self, niece comes by to assist with showers. Pt reports she does what she can for cooking/cleaning- can make simple meals like oatmel, etc.     Extremity/Trunk Assessment   Upper Extremity Assessment Upper Extremity Assessment: Defer to OT evaluation RUE Deficits / Details: arthritis in B hands impacting fine motor and grasp RUE Coordination: decreased fine motor LUE Deficits / Details: arthritis in B hands impacting fine motor and grasp LUE Coordination: decreased fine motor    Lower Extremity Assessment Lower Extremity Assessment: RLE deficits/detail;LLE deficits/detail RLE Deficits / Details: knee flexion extremely limited, ~30 degrees RLE Coordination: decreased fine motor;decreased gross motor LLE Deficits / Details: knee flexion extremely limited, ~30 degrees secondary to arthritis LLE Coordination: decreased fine motor;decreased gross motor    Cervical / Trunk Assessment Cervical / Trunk Assessment: Kyphotic;Other exceptions (extreme forward flexion of trunk in standing, utilizes ROllator at baseline)  Communication   Communication Communication: No apparent difficulties    Cognition Arousal: Alert Behavior During Therapy: WFL for tasks  assessed/performed                             Following commands: Intact       Cueing Cueing Techniques: Verbal cues     General Comments General comments (skin integrity, edema, etc.): VSS on RA        Assessment/Plan    PT Assessment Patient needs continued PT services  PT Problem List Decreased strength;Decreased range of motion;Decreased activity tolerance;Decreased balance;Decreased mobility;Decreased coordination;Decreased knowledge of use of DME;Decreased safety awareness;Cardiopulmonary status limiting activity       PT Treatment Interventions DME instruction;Gait training;Functional mobility training;Therapeutic activities;Therapeutic exercise;Balance training;Cognitive remediation;Patient/family education    PT Goals (Current goals can be found in the Care Plan section)  Acute Rehab PT Goals Patient Stated Goal: go home PT Goal Formulation: With patient Time For Goal Achievement: 05/11/24 Potential to Achieve Goals: Fair    Frequency Min 2X/week        AM-PAC PT 6 Clicks Mobility  Outcome Measure Help needed turning from your back to your side while in a flat bed without using bedrails?: A Little Help needed moving from lying on your back to sitting on the side of a flat bed without using bedrails?: A Lot Help needed moving to and from a bed to a chair (including a wheelchair)?: Total Help needed standing up from a chair using your arms (e.g., wheelchair or bedside chair)?: Total Help needed to walk in hospital room?: A Little Help needed climbing 3-5 steps with a railing? : Total 6 Click Score: 11  End of Session Equipment Utilized During Treatment: Gait belt Activity Tolerance: Patient tolerated treatment well Patient left: in bed;with call bell/phone within reach;with bed alarm set Nurse Communication: Mobility status PT Visit Diagnosis: Unsteadiness on feet (R26.81);Repeated falls (R29.6);History of falling (Z91.81);Muscle weakness  (generalized) (M62.81);Difficulty in walking, not elsewhere classified (R26.2)    Time: 8595-8576 PT Time Calculation (min) (ACUTE ONLY): 19 min   Charges:   PT Evaluation $PT Eval Low Complexity: 1 Low   PT General Charges $$ ACUTE PT VISIT: 1 Visit         [name] PT, DPT Acute Rehabilitation Services Please use secure chat or  Call Office [phone]   [name] 04/27/2024, 3:03 PM

## 2024-04-27 NOTE — NC FL2 (Addendum)
 [place]  MEDICAID FL2 LEVEL OF CARE FORM     IDENTIFICATION  Patient Name: [name] Birthdate: [birth year] Sex: female Admission Date (Current Location): 04/25/2024  [facility] and [place] Number:  Producer, Television/film/video and Address:  [facility] [place], [address] [place], [place] [address]      Provider Number: [id number]  Attending Physician Name and Address:  Kathrin Mignon DASEN, MD  Relative Name and Phone Number:  [name], brother - [phone]    Current Level of Care: Hospital Recommended Level of Care: Skilled Nursing Facility Prior Approval Number:    Date Approved/Denied:   PASRR Number:  [id number]   Discharge Plan: SNF    Current Diagnoses: Patient Active Problem List   Diagnosis Date Noted   Elevated troponin 04/26/2024   Rectovaginal fistula with involvement of anal sphincter 04/26/2024   Gross hematuria 04/26/2024   Increased endometrial stripe thickness 04/26/2024   Hypertensive urgency 04/25/2024   Right carpal tunnel syndrome 03/01/2022   Left carpal tunnel syndrome 03/01/2022   Pain in both hands 08/14/2020   Primary osteoarthritis of both hips 08/14/2020   Primary osteoarthritis of both knees 08/14/2020   DDD (degenerative disc disease), lumbar 08/14/2020   EAR PAIN, RIGHT 09/02/2007   POSTMENOPAUSAL STATUS 03/28/2007   HYPERLIPIDEMIA 05/27/2006   ANEMIA, MICROCYTIC 05/27/2006   HYPERTENSION 05/27/2006   ALLERGIC RHINITIS 05/27/2006   RECTOVAGINAL FISTULA 05/27/2006   OSTEOARTHRITIS 05/27/2006   HAMSTRING TENDINITIS 05/27/2006   OSTEOPENIA 05/27/2006   INSOMNIA 05/27/2006    Orientation RESPIRATION BLADDER Height & Weight     Self, Time, Situation  Normal Continent Weight:   Height:     BEHAVIORAL SYMPTOMS/MOOD NEUROLOGICAL BOWEL NUTRITION STATUS      Continent Diet (See discharge summary)  AMBULATORY STATUS COMMUNICATION OF NEEDS Skin   Limited Assist Verbally Normal                        Personal Care Assistance Level of Assistance  Feeding, Bathing, Dressing Bathing Assistance: Limited assistance Feeding assistance: Limited assistance Dressing Assistance: Limited assistance     Functional Limitations Info  Sight, Hearing, Speech Sight Info: Adequate Hearing Info: Adequate Speech Info: Adequate    SPECIAL CARE FACTORS FREQUENCY  PT (By licensed PT), OT (By licensed OT)     PT Frequency: 5 x per week OT Frequency: 5 x per week            Contractures Contractures Info: Not present    Additional Factors Info  Code Status, Allergies Code Status Info: DNR Allergies Info: penicillin, sulfa           Current Medications (04/27/2024):  This is the current hospital active medication list Current Facility-Administered Medications  Medication Dose Route Frequency Provider Last Rate Last Admin   acetaminophen  (TYLENOL ) tablet 650 mg  650 mg Oral Q6H PRN Amponsah, Prosper M, MD       Or   acetaminophen  (TYLENOL ) suppository 650 mg  650 mg Rectal Q6H PRN Amponsah, Prosper M, MD       bisacodyl (DULCOLAX) EC tablet 5 mg  5 mg Oral Daily PRN Amponsah, Prosper M, MD       cloNIDine  (CATAPRES ) tablet 0.2 mg  0.2 mg Oral Q8H Gonfa, Taye T, MD       diclofenac  Sodium (VOLTAREN ) 1 % topical gel 2 g  2 g Topical QID Gonfa, Taye T, MD   2 g at 04/27/24 1311   enoxaparin (  LOVENOX) injection 40 mg  40 mg Subcutaneous Q24H Amponsah, Prosper M, MD   40 mg at 04/27/24 9181   Ferrous Fumarate (HEMOCYTE - 106 mg FE) tablet 318 mg of iron  318 mg of iron Oral Q breakfast Amponsah, Prosper M, MD   318 mg of iron at 04/27/24 0819   HYDROcodone-acetaminophen  (NORCO/VICODIN) 5-325 MG per tablet 1 tablet  1 tablet Oral BID PRN Amponsah, Prosper M, MD   1 tablet at 04/26/24 1553   losartan (COZAAR) tablet 100 mg  100 mg Oral Daily Gonfa, Taye T, MD   100 mg at 04/27/24 0818   ondansetron (ZOFRAN) tablet 4 mg  4 mg Oral Q6H PRN Lou Claretta HERO, MD       Or   ondansetron  (ZOFRAN) injection 4 mg  4 mg Intravenous Q6H PRN Amponsah, Prosper M, MD       polyethylene glycol (MIRALAX / GLYCOLAX) packet 17 g  17 g Oral Daily Gonfa, Taye T, MD   17 g at 04/26/24 1050   senna-docusate (Senokot-S) tablet 1 tablet  1 tablet Oral QHS PRN Amponsah, Prosper M, MD         Discharge Medications: Please see discharge summary for a list of discharge medications.  Relevant Imaging Results:  Relevant Lab Results:   Additional Information SS# [id number]  Rosaline JONELLE Joe, RN

## 2024-04-28 DIAGNOSIS — I160 Hypertensive urgency: Secondary | ICD-10-CM | POA: Diagnosis not present

## 2024-04-28 DIAGNOSIS — R9389 Abnormal findings on diagnostic imaging of other specified body structures: Secondary | ICD-10-CM | POA: Diagnosis not present

## 2024-04-28 DIAGNOSIS — R310 Gross hematuria: Secondary | ICD-10-CM | POA: Diagnosis not present

## 2024-04-28 DIAGNOSIS — R7989 Other specified abnormal findings of blood chemistry: Secondary | ICD-10-CM | POA: Diagnosis not present

## 2024-04-28 LAB — CBC
HCT: 30.4 % — ABNORMAL LOW (ref 36.0–46.0)
Hemoglobin: 9.6 g/dL — ABNORMAL LOW (ref 12.0–15.0)
MCH: 22.7 pg — ABNORMAL LOW (ref 26.0–34.0)
MCHC: 31.6 g/dL (ref 30.0–36.0)
MCV: 71.9 fL — ABNORMAL LOW (ref 80.0–100.0)
Platelets: 238 K/uL (ref 150–400)
RBC: 4.23 MIL/uL (ref 3.87–5.11)
RDW: 15.1 % (ref 11.5–15.5)
WBC: 5.4 K/uL (ref 4.0–10.5)
nRBC: 0 % (ref 0.0–0.2)

## 2024-04-28 LAB — RENAL FUNCTION PANEL
Albumin: 3.2 g/dL — ABNORMAL LOW (ref 3.5–5.0)
Anion gap: 8 (ref 5–15)
BUN: 27 mg/dL — ABNORMAL HIGH (ref 8–23)
CO2: 22 mmol/L (ref 22–32)
Calcium: 9.7 mg/dL (ref 8.9–10.3)
Chloride: 109 mmol/L (ref 98–111)
Creatinine, Ser: 1.13 mg/dL — ABNORMAL HIGH (ref 0.44–1.00)
GFR, Estimated: 47 mL/min — ABNORMAL LOW (ref >60)
Glucose, Bld: 100 mg/dL — ABNORMAL HIGH (ref 70–99)
Phosphorus: 3.9 mg/dL (ref 2.5–4.6)
Potassium: 4 mmol/L (ref 3.5–5.1)
Sodium: 139 mmol/L (ref 135–145)

## 2024-04-28 LAB — MAGNESIUM: Magnesium: 2.3 mg/dL (ref 1.7–2.4)

## 2024-04-28 NOTE — Progress Notes (Addendum)
 PROGRESS NOTE  [name] FMW:[id number] DOB: 1933/12/19   PCP: Sim Emery CROME, MD  Patient is from: Lives with son.  Uses rolling walker at baseline.  DOA: 04/25/2024 LOS: 3  Chief complaints No chief complaint on file.    Brief Narrative / Interim history: 89 year old F with PMH of rectovaginal fistula, HTN, HLD, osteoarthritis and anemia presented to ED with bleeding.  Noted blood trickling down her legs on Friday.  Bleeding subsided but she noted blood when she wiped after urination on Saturday, and she came to ED.  Not on anticoagulation, antiplatelet or NSAID.  Denies history of hemorrhoids or diverticulosis.  Denies trauma fall.  In ED, hypertensive to 140/112.  Slightly tachycardic to 110.  Hgb 12.1 (10.6 in 2022).  UA with small Hgb and 6-10 RBC/hpf.  Troponin elevated to 269 and 366.  BMP without significant finding.  EKG sinus rhythm with prolonged PR but no acute ischemic finding.  CT abdomen and pelvis showed 2.3 cm endometrial wall thickening, fibroid uterus and moderate stool in colon.  Cardiology consulted about elevated troponin and BP recommended trending troponin, TTE and consulting again if abnormal finding on TTE.   Hgb stable at about 9.6 after initial drop.  BRBPR noted during peri care by RN the morning of 10/21.  GI consulted.    Blood pressure improved.  TTE without significant finding    Subjective: Seen and examined earlier this morning.  No major events overnight or this morning.  Patient was sitting on bedside chair after working with therapy.  No complaints.  She had concern about one of her BP medication contributing to bleeding.  I have reassured her that blood pressure medication will not cause bleeding.  In fact, it will reduce her risk of bleeding.  RN noted BRBPR during peri care this morning.  No bowel movements.   Assessment and plan: Hypertensive urgency: SBP as high as 240 in ED. Normotensive.  Reassured patient that BP meds will  not cause bleeding.  In fact they will decrease bleeding from uncontrolled hypertension. -Decreased clonidine  to 0.2 mg 3 times daily -Continue losartan 100 mg daily -Added holding parameters   Hematochezia/rectal bleed: Blood noted during exam by EDP but source was not clear.  CT abdomen and pelvis with endometrial wall thickening and fibroid uterus.  Hemoglobin dropped from 12.1-9.7 but her Hgb was 10.6 about 3 years ago.  UA with small Hgb and 6-10 RBC on hpf.  Not on AC, antiplatelets or NSAIDs.  RN noted BRBPR on 10/21.  Anemia panel with some iron deficiency. -Normalize BP as above -GI consulted  -Outpatient follow-up with gynecology for endometrial wall thickening -Continue MiraLAX for possible constipation -Monitor CBC.    Addendum GI recommended GYN evaluation given history of rectovaginal fistula.   Gyn, Dr. Ozan consulted and will see patient  Elevated troponin/type II MI: Likely due to uncontrolled BP.  EKG without acute ischemic finding.  Troponin trended from 269 and peaked at 393.  TTE without significant finding.  Patient without cardiopulmonary symptoms -Normalize BP as above   Microcytic iron deficiency anemia: Hgb 12.1 on admission.  Unknown b/l but 10.6 about 3 years ago Recent Labs    04/25/24 1419 04/26/24 0324 04/27/24 0449 04/28/24 0438  HGB 12.1 10.2* 9.7* 9.6*  -Management as above -Will start p.o. iron on discharge.   Endometrial wall thickening/uterine fibroid -Outpatient follow-up with gynecology  Osteoarthritis/generalized weakness: Significant pain in both hands. -Tylenol  as needed -Added Voltaren  gel for hands. -PT/OT eval  History of rectovaginal fistula: Reportedly diagnosed in 2007 during colonoscopy - GYN evaluation as above  Hypokalemia -Monitor replenish K and Mg as appropriate  Generalized weakness/physical deconditioning: Patient lives alone. - Therapy recommended SNF.  Patient in agreement.   There is no height or weight on file  to calculate BMI.          DVT prophylaxis:  enoxaparin (LOVENOX) injection 40 mg Start: 04/26/24 1000  Code Status: DNR Family Communication: None at bedside Level of care: Telemetry Medical Status is: Inpatient Remains inpatient appropriate because: Elevated troponin, bleeding   Final disposition: Home   35 minutes with more than 50% spent in reviewing records, counseling patient/family and coordinating care.  Consultants:  Cardiology in ED Gastroenterology  Procedures: None  Microbiology summarized: None  Objective: Vitals:   04/27/24 2052 04/28/24 0411 04/28/24 0905 04/28/24 1219  BP: (!) 132/53 132/68 (!) 151/65 136/61  Pulse: 68 67 80 95  Resp:      Temp: 98.7 F (37.1 C) 98.2 F (36.8 C) 98 F (36.7 C) 98 F (36.7 C)  TempSrc: Oral  Oral Oral  SpO2: 100% 100% 99% 100%    Examination:  GENERAL: No apparent distress.  Nontoxic. HEENT: MMM.  Vision and hearing grossly intact.  NECK: Supple.  No apparent JVD.  RESP:  No IWOB.  Fair aeration bilaterally. CVS:  RRR. Heart sounds normal.  ABD/GI/GU: BS+. Abd soft, NTND.  MSK/EXT:  Moves extremities. No apparent deformity. No edema.  SKIN: no apparent skin lesion or wound NEURO: AA.  Oriented appropriately.  No apparent focal neuro deficit. PSYCH: Calm. Normal affect.   Sch Meds:  Scheduled Meds:  cloNIDine   0.2 mg Oral Q8H   diclofenac  Sodium  2 g Topical QID   enoxaparin (LOVENOX) injection  40 mg Subcutaneous Q24H   Ferrous Fumarate  318 mg of iron Oral Q breakfast   losartan  100 mg Oral Daily   polyethylene glycol  17 g Oral Daily   Continuous Infusions: PRN Meds:.acetaminophen  **OR** acetaminophen , bisacodyl, HYDROcodone-acetaminophen , ondansetron **OR** ondansetron (ZOFRAN) IV, senna-docusate  Antimicrobials: Anti-infectives (From admission, onward)    None        I have personally reviewed the following labs and images: CBC: Recent Labs  Lab 04/25/24 1419 04/26/24 0324  04/27/24 0449 04/28/24 0438  WBC 6.9 8.0 6.2 5.4  HGB 12.1 10.2* 9.7* 9.6*  HCT 39.5 32.4* 30.2* 30.4*  MCV 74.4* 71.7* 71.4* 71.9*  PLT 252 236 223 238   BMP &GFR Recent Labs  Lab 04/25/24 1507 04/26/24 0324 04/26/24 0521 04/27/24 0449 04/28/24 0438  NA 139 141  --  139 139  K 3.6 3.3*  --  4.3 4.0  CL 107 106  --  108 109  CO2 18* 21*  --  22 22  GLUCOSE 99 114*  --  103* 100*  BUN 16 14  --  22 27*  CREATININE 0.79 0.81  --  1.08* 1.13*  CALCIUM 10.5* 10.1  --  9.6 9.7  MG  --   --  2.1 2.1 2.3  PHOS  --   --   --  3.2 3.9   CrCl cannot be calculated (Unknown ideal weight.). Liver & Pancreas: Recent Labs  Lab 04/27/24 0449 04/28/24 0438  ALBUMIN 3.3* 3.2*   No results for input(s): LIPASE, AMYLASE in the last 168 hours. No results for input(s): AMMONIA in the last 168 hours. Diabetic: No results for input(s): HGBA1C in the last 72 hours. No results for input(s):  GLUCAP in the last 168 hours. Cardiac Enzymes: No results for input(s): CKTOTAL, CKMB, CKMBINDEX, TROPONINI in the last 168 hours. No results for input(s): PROBNP in the last 8760 hours. Coagulation Profile: No results for input(s): INR, PROTIME in the last 168 hours. Thyroid Function Tests: No results for input(s): TSH, T4TOTAL, FREET4, T3FREE, THYROIDAB in the last 72 hours. Lipid Profile: No results for input(s): CHOL, HDL, LDLCALC, TRIG, CHOLHDL, LDLDIRECT in the last 72 hours. Anemia Panel: Recent Labs    04/26/24 0324 04/26/24 1533  VITAMINB12  --  >4,000*  FOLATE >20.0  --   FERRITIN 41  --   TIBC 365  --   IRON 47  --   RETICCTPCT 1.3  --    Urine analysis:    Component Value Date/Time   COLORURINE YELLOW 04/25/2024 1348   APPEARANCEUR CLEAR 04/25/2024 1348   LABSPEC 1.011 04/25/2024 1348   PHURINE 7.0 04/25/2024 1348   GLUCOSEU NEGATIVE 04/25/2024 1348   HGBUR SMALL (A) 04/25/2024 1348   BILIRUBINUR NEGATIVE 04/25/2024 1348    KETONESUR NEGATIVE 04/25/2024 1348   PROTEINUR 100 (A) 04/25/2024 1348   NITRITE NEGATIVE 04/25/2024 1348   LEUKOCYTESUR NEGATIVE 04/25/2024 1348   Sepsis Labs: Invalid input(s): PROCALCITONIN, LACTICIDVEN  Microbiology: No results found for this or any previous visit (from the past 240 hours).  Radiology Studies: No results found.     Harrell Niehoff T. Leilah Polimeni [facility]  If 7PM-7AM, please contact night-coverage [email or web address] 04/28/2024, 2:16 PM

## 2024-04-28 NOTE — Progress Notes (Signed)
 Mobility Specialist: Progress Note   04/28/24 1600  Mobility  Activity Ambulated with assistance  Level of Assistance Contact guard assist, steadying assist  Assistive Device Front wheel walker  Distance Ambulated (ft) 70 ft (35' x2)  Activity Response Tolerated well  Mobility Referral Yes  Mobility visit 1 Mobility  Mobility Specialist Start Time (ACUTE ONLY) 1350  Mobility Specialist Stop Time (ACUTE ONLY) 1410  Mobility Specialist Time Calculation (min) (ACUTE ONLY) 20 min    Pt received in chair, agreeable to mobility session. MinA for STS, pt requiring verbal cues to plant feet under her before she stands. CGA for ambulation, ambulated a few feet into the hallway and back to the chair twice - took a seated break in between bouts. No complaints. Returned to chair at end of session. Left in chair with all needs met, call bell in reach.   Ileana Lute Mobility Specialist Please contact via SecureChat or Rehab office at [phone]

## 2024-04-28 NOTE — Plan of Care (Signed)

## 2024-04-28 NOTE — Plan of Care (Signed)
 Patient calm and cooperative A&O X3. Patient declined morning medications stating she believes its what is making her bleed. Patient left with call bell in reach and bed in lowest position.   Problem: Education: Goal: Knowledge of General Education information will improve Description: Including pain rating scale, medication(s)/side effects and non-pharmacologic comfort measures Outcome: Progressing   Problem: Clinical Measurements: Goal: Ability to maintain clinical measurements within normal limits will improve Outcome: Progressing   Problem: Activity: Goal: Risk for activity intolerance will decrease Outcome: Progressing   Problem: Nutrition: Goal: Adequate nutrition will be maintained Outcome: Progressing   Problem: Coping: Goal: Level of anxiety will decrease Outcome: Progressing   Problem: Pain Managment: Goal: General experience of comfort will improve and/or be controlled Outcome: Progressing   Problem: Safety: Goal: Ability to remain free from injury will improve Outcome: Progressing

## 2024-04-28 NOTE — Care Management Important Message (Signed)
 Important Message  Patient Details  Name: [name] MRN: [id number] Date of Birth: Aug 04, 1933   Important Message Given:  Yes - Medicare IM     [name] 04/28/2024, 3:08 PM

## 2024-04-28 NOTE — Progress Notes (Signed)
 Occupational Therapy Treatment Patient Details Name: [name] MRN: [id number] DOB: [birth year] Today's Date: 04/28/2024   History of present illness 89 y.o. female who presented to the ED 10/18 for evaluation of hematuria.  HR 90-110s, SBP 170-230s, with up trending troponin. CT abdomen and pelvis with endometrial wall thickening and fibroid uterus. Admitted for treatment of HTN urgency and bleeding. EFY:lwrnwumnoozi hypertension, hyperlipidemia, osteoarthritis, rectovaginal fistula, anemia and insomnia   OT comments  Pt making incremental progress towards OT goals. Pt able to mobilize slightly better than yesterday as pt reports stiffness occurs with prolonged time in bed. Pt able to stand and manage transfers using techniques to combat impaired knee ROM to transfer with no more than Min A using RW. Focused session on AE education to maximize safety and independence with LB ADLs w/ overall Min A needed and pt actively engaged. Provided handout on further AE information. As pt with limited assist at home, continue to encourage consideration of short postacute rehab stay to decrease overall fall risk with daily routine at home.      If plan is discharge home, recommend the following:  A little help with walking and/or transfers;A little help with bathing/dressing/bathroom;Assistance with Charity fundraiser (measurements OT);Wheelchair cushion (measurements OT) (if does not already have)    Recommendations for Other Services      Precautions / Restrictions Precautions Precautions: Fall Precaution/Restrictions Comments: impaired B knee flexion Restrictions Weight Bearing Restrictions Per Provider Order: No       Mobility Bed Mobility Overal bed mobility: Needs Assistance Bed Mobility: Supine to Sit     Supine to sit: Supervision, HOB elevated, Used rails          Transfers Overall transfer level: Needs assistance Equipment  used: Rolling walker (2 wheels) Transfers: Sit to/from Stand, Bed to chair/wheelchair/BSC Sit to Stand: Min assist, Contact guard assist, From elevated surface     Step pivot transfers: Contact guard assist     General transfer comment: from regular height bed, Min A to stand with pt slowly standing and walking feet back to achieve upright posture. Placed blankets and pillow in chair to raise height with pt able to stand with CGA using RW using same method. Once up, able to step to recliner with cues to maintain grasp on RW     Balance Overall balance assessment: Needs assistance Sitting-balance support: No upper extremity supported, Feet supported Sitting balance-Leahy Scale: Fair     Standing balance support: Bilateral upper extremity supported, During functional activity Standing balance-Leahy Scale: Poor                             ADL either performed or assessed with clinical judgement   ADL Overall ADL's : Needs assistance/impaired                     Lower Body Dressing: Minimal assistance;Sitting/lateral leans;Sit to/from stand Lower Body Dressing Details (indicate cue type and reason): Emphasis on AE education. With Min A for initial education, pt able to demo ability to doff sock with reacher, don sock with sock aide. Demonstrated use of shoehorn and simulated use of reacher (or cane ) to don pants around feet. Provided handout and written info on where to obtain, sometimes called hip kit, etc                    Extremity/Trunk Assessment Upper Extremity Assessment Upper  Extremity Assessment: Right hand dominant;RUE deficits/detail;LUE deficits/detail RUE Deficits / Details: arthritis in B hands impacting fine motor and grasp RUE Coordination: decreased fine motor LUE Deficits / Details: arthritis in B hands impacting fine motor and grasp LUE Coordination: decreased fine motor   Lower Extremity Assessment Lower Extremity Assessment:  Defer to PT evaluation (significant impairment in ability to flex B knees)        Vision   Vision Assessment?: Wears glasses for reading;No apparent visual deficits   Perception     Praxis     Communication Communication Communication: No apparent difficulties   Cognition Arousal: Alert Behavior During Therapy: WFL for tasks assessed/performed Cognition: No apparent impairments                               Following commands: Intact        Cueing   Cueing Techniques: Verbal cues  Exercises      Shoulder Instructions       General Comments      Pertinent Vitals/ Pain       Pain Assessment Pain Assessment: No/denies pain  Home Living                                          Prior Functioning/Environment              Frequency  Min 2X/week        Progress Toward Goals  OT Goals(current goals can now be found in the care plan section)  Progress towards OT goals: Progressing toward goals  Acute Rehab OT Goals Patient Stated Goal: be able to go home, stay active OT Goal Formulation: With patient Time For Goal Achievement: 05/11/24 Potential to Achieve Goals: Good ADL Goals Pt Will Perform Lower Body Dressing: with modified independence;with adaptive equipment;sit to/from stand;sitting/lateral leans Pt Will Transfer to Toilet: with contact guard assist;ambulating Additional ADL Goal #1: Pt to demo ability to stand > 5 min during ADLs/mobility without need for seated rest break  Plan      Co-evaluation                 AM-PAC OT 6 Clicks Daily Activity     Outcome Measure   Help from another person eating meals?: A Little Help from another person taking care of personal grooming?: A Little Help from another person toileting, which includes using toliet, bedpan, or urinal?: A Little Help from another person bathing (including washing, rinsing, drying)?: A Little Help from another person to put on and  taking off regular upper body clothing?: A Little Help from another person to put on and taking off regular lower body clothing?: A Little 6 Click Score: 18    End of Session Equipment Utilized During Treatment: Gait belt;Rolling walker (2 wheels)  OT Visit Diagnosis: Unsteadiness on feet (R26.81);Other abnormalities of gait and mobility (R26.89);Muscle weakness (generalized) (M62.81)   Activity Tolerance Patient tolerated treatment well   Patient Left in chair;with call bell/phone within reach;with chair alarm set   Nurse Communication Mobility status        Time: 8968-8897 OT Time Calculation (min): 31 min  Charges: OT General Charges $OT Visit: 1 Visit OT Treatments $Self Care/Home Management : 23-37 mins  Mliss NOVAK, OTR/L Acute Rehab Services Office: [phone]   Mliss Fish 04/28/2024, 12:21 PM

## 2024-04-29 DIAGNOSIS — I160 Hypertensive urgency: Secondary | ICD-10-CM | POA: Diagnosis not present

## 2024-04-29 NOTE — Progress Notes (Signed)
 Mobility Specialist: Progress Note   04/29/24 1434  Mobility  Activity Ambulated with assistance  Level of Assistance Contact guard assist, steadying assist  Assistive Device Front wheel walker  Distance Ambulated (ft) 80 ft  Activity Response Tolerated well  Mobility Referral Yes  Mobility visit 1 Mobility  Mobility Specialist Start Time (ACUTE ONLY) 1410  Mobility Specialist Stop Time (ACUTE ONLY) 1425  Mobility Specialist Time Calculation (min) (ACUTE ONLY) 15 min    Pt received in bed, agreeable to mobility session. SV for bed mobility. CGA for STS from elevated bed height. CGA for ambulation down the hallway and back to the room. Returned to room without fault. Left in chair with all needs met, call bell in reach.   Ileana Lute Mobility Specialist Please contact via SecureChat or Rehab office at [phone]

## 2024-04-29 NOTE — Progress Notes (Signed)
 OBSTETRICS AND GYNECOLOGY ATTENDING CONSULT NOTE  Consult Date: 04/29/2024  Reason for Consult: postmenopausal bleeding Consulting Provider: Dr. Leng Montesdeoca    Assessment/Plan: 1) Postmenopausal bleeding -reviewed US  with thickened endometrium -unfortunately due to significant osteoarthritis, but unable to bend her knees.  Pelvic exam very challenging and unable to complete bedside EMB both due to positioning and endometrial stenosis (See attempted procedure below) -based on exam and history there is no evidence of a fistula -suspect bleeding was uterine in nature.  Unfortunately cannot rule out endometrial carcinoma without biopsy -currently bleeding has improved and do not think immediate intervention is indicated -would recommend outpatient follow up should the bleeding persist to consider alternative options for endometrial sampling  -While notes specify rectovaginal fistula- pt does not report of history of this diagnosis.  She does not report leaking stool, blood in her stool or prior medical or surgical history that would suggest a fistula.  At this time, working diagnosis would be postmenopausal bleeding not a fistula.  -will sign off at this time   Appreciate care of [name] by her primary team  Please call [phone] [facility] OB/GYN Consult Attending Monday-Friday 8am - 5pm) or [phone] [facility] OB/GYN Attending On Call all day, every day) for any gynecologic concerns at any time.  Thank you for involving us  in the care of this patient.  Total consultation time including face-to-face time with patient (>50% of time), reviewing chart and documentation: 30 minutes  Marycatherine Maniscalco, DO Attending Obstetrician & Gynecologist, [facility] for [facility], [facility]   History of Present Illness: [name] is an 89 y.o. PM female who was admitted for hypertensive urgency and noted to have bleeding.  She reports that starting on  Sunday she noted bright red blood that ran down her legs on one episode, but mostly when she wipes she noted blood.   Initially it was bright red then only brown spotting. She notes that with time the spotting has improved.  She denies urinary or bowel issues.  Per note, there was concern about fistula, but patient denies leaking stool or flatus through vagina.  Denies blood in stool.  Denies pelvic or abdominal pain.  Denies nausea or vomiting.  Reports no other acute gynecologic concern.  Prior to this pt denies vaginal bleeding for many years  Patient Active Problem List   Diagnosis Date Noted   Elevated troponin 04/26/2024   Rectovaginal fistula with involvement of anal sphincter 04/26/2024   Gross hematuria 04/26/2024   Increased endometrial stripe thickness 04/26/2024   Hypertensive urgency 04/25/2024   Right carpal tunnel syndrome 03/01/2022   Left carpal tunnel syndrome 03/01/2022   Pain in both hands 08/14/2020   Primary osteoarthritis of both hips 08/14/2020   Primary osteoarthritis of both knees 08/14/2020   DDD (degenerative disc disease), lumbar 08/14/2020   EAR PAIN, RIGHT 09/02/2007   POSTMENOPAUSAL STATUS 03/28/2007   HYPERLIPIDEMIA 05/27/2006   ANEMIA, MICROCYTIC 05/27/2006   HYPERTENSION 05/27/2006   ALLERGIC RHINITIS 05/27/2006   RECTOVAGINAL FISTULA 05/27/2006   OSTEOARTHRITIS 05/27/2006   HAMSTRING TENDINITIS 05/27/2006   OSTEOPENIA 05/27/2006   INSOMNIA 05/27/2006    Past Medical History:  Diagnosis Date   Allergic rhinitis    Allergic rhinitis    Hamstring tendonitis at origin    Right hamstring   Hyperlipidemia    Hypertension    Well controlled.    Insomnia    Microcytic anemia    Baseline about 11.  MCV approx 70.  Ferritin 196 in 9/08   Osteoarthritis    Bilateral knees, x ray 6/05:  Right knee- Severe tricompartmental degenerative arthritis with loose bodies.   Osteopenia    Hx, no longer osteopenic,  normal DEXA 11/07   Rectovaginal fistula     Seen during colonoscopy    Past Surgical History:  Procedure Laterality Date   CATARACT EXTRACTION, BILATERAL     KNEE ARTHROSCOPY Bilateral     Family History  Problem Relation Age of Onset   Breast cancer Mother    Breast cancer Sister    Cancer Brother    Healthy Son     Social History:  reports that she has never smoked. She has never used smokeless tobacco. She reports that she does not drink alcohol and does not use drugs.  Allergies:  Allergies  Allergen Reactions   Penicillins Other (See Comments)    Skin turned purple   Sulfonamide Derivatives Other (See Comments)    swell    Medications: I have reviewed the patient's current medications.  Review of Systems: Pertinent items are noted in HPI.  Focused Physical Examination: BP (!) 145/51 (BP Location: Right Arm)   Pulse 69   Temp 97.8 F (36.6 C) (Oral)   Resp 16   SpO2 100%  CONSTITUTIONAL: Well-developed, well-nourished female in no acute distress.  NECK: Supple, no masses.  Normal thyroid.  SKIN: Skin is warm and dry. No rash noted. Not diaphoretic. No erythema. No pallor. NEUROLGIC: Alert and oriented to person and place. PSYCHIATRIC: Normal mood and affect. Normal behavior. Normal judgment and thought content. CARDIOVASCULAR: Normal heart rate noted, regular rhythm RESPIRATORY: Clear to auscultation bilaterally. Effort and breath sounds normal, no problems with respiration noted. ABDOMEN: Soft, normal bowel sounds, no distention noted.  No tenderness, rebound or guarding.  PELVIC: Pt unable to bend legs.  Pt placed on bedpan to raise hips. Normal appearing external genitalia; normal appearing vaginal mucosa and cervix.  No discharge. Normal uterine size, no other palpable masses, no uterine or adnexal tenderness. Done in the presence of a chaperone. MUSCULOSKELETAL: no calf tenderness bilaterally  Endometrial Biopsy Procedure Note  Pre-operative Diagnosis: postmenopausal bleeding  Post-operative  Diagnosis: same  Procedure Details   The risks (including infection, bleeding, pain, and uterine perforation) and benefits of the procedure were explained to the patient and Written informed consent was obtained.    Cervix appeared stenotic.  A single tooth tenaculum was applied to the anterior lip of the cervix for stabilization.  A Pipelle endometrial aspirator was attempted; however, could not be passed through external os  Due to limited mobility and visualization, unable to use os finder or further instrumentation to obtain biopsy.  Instruments were then removed.   Condition: Stable  Complications: Unsuccessful procedure  Labs and Imaging: Results for orders placed or performed during the hospital encounter of 04/25/24 (from the past 72 hours)  Vitamin B12     Status: Abnormal   Collection Time: 04/26/24  3:33 PM  Result Value Ref Range   Vitamin B-12 >4,000 (H) 180 - 914 pg/mL    Comment: RESULT CONFIRMED BY AUTOMATED DILUTION (NOTE) This assay is not validated for testing neonatal or myeloproliferative syndrome specimens for Vitamin B12 levels. Performed at [facility], [address], [place], [place] [address]   Renal function panel     Status: Abnormal   Collection Time: 04/27/24  4:49 AM  Result Value Ref Range   Sodium 139 135 - 145 mmol/L   Potassium  4.3 3.5 - 5.1 mmol/L   Chloride 108 98 - 111 mmol/L   CO2 22 22 - 32 mmol/L   Glucose, Bld 103 (H) 70 - 99 mg/dL    Comment: Glucose reference range applies only to samples taken after fasting for at least 8 hours.   BUN 22 8 - 23 mg/dL   Creatinine, Ser 8.91 (H) 0.44 - 1.00 mg/dL   Calcium 9.6 8.9 - 89.6 mg/dL   Phosphorus 3.2 2.5 - 4.6 mg/dL   Albumin 3.3 (L) 3.5 - 5.0 g/dL   GFR, Estimated 49 (L) >60 mL/min    Comment: (NOTE) Calculated using the CKD-EPI Creatinine Equation (2021)    Anion gap 9 5 - 15    Comment: Performed at [facility], [address] [place], [place] [address]  Magnesium      Status: None   Collection Time: 04/27/24  4:49 AM  Result Value Ref Range   Magnesium 2.1 1.7 - 2.4 mg/dL    Comment: Performed at [facility], [address], [place], [place] [address]  CBC     Status: Abnormal   Collection Time: 04/27/24  4:49 AM  Result Value Ref Range   WBC 6.2 4.0 - 10.5 K/uL   RBC 4.23 3.87 - 5.11 MIL/uL   Hemoglobin 9.7 (L) 12.0 - 15.0 g/dL   HCT 69.7 (L) 63.9 - 53.9 %   MCV 71.4 (L) 80.0 - 100.0 fL   MCH 22.9 (L) 26.0 - 34.0 pg   MCHC 32.1 30.0 - 36.0 g/dL   RDW 84.4 88.4 - 84.4 %   Platelets 223 150 - 400 K/uL   nRBC 0.0 0.0 - 0.2 %    Comment: Performed at [facility], [address] [place], [place] [address]  Renal function panel     Status: Abnormal   Collection Time: 04/28/24  4:38 AM  Result Value Ref Range   Sodium 139 135 - 145 mmol/L   Potassium 4.0 3.5 - 5.1 mmol/L   Chloride 109 98 - 111 mmol/L   CO2 22 22 - 32 mmol/L   Glucose, Bld 100 (H) 70 - 99 mg/dL    Comment: Glucose reference range applies only to samples taken after fasting for at least 8 hours.   BUN 27 (H) 8 - 23 mg/dL   Creatinine, Ser 8.86 (H) 0.44 - 1.00 mg/dL   Calcium 9.7 8.9 - 89.6 mg/dL   Phosphorus 3.9 2.5 - 4.6 mg/dL   Albumin 3.2 (L) 3.5 - 5.0 g/dL   GFR, Estimated 47 (L) >60 mL/min    Comment: (NOTE) Calculated using the CKD-EPI Creatinine Equation (2021)    Anion gap 8 5 - 15    Comment: Performed at [facility], [address], [place], [place] [address]  Magnesium     Status: None   Collection Time: 04/28/24  4:38 AM  Result Value Ref Range   Magnesium 2.3 1.7 - 2.4 mg/dL    Comment: Performed at [facility], [address] [place], [place] [address]  CBC     Status: Abnormal   Collection Time: 04/28/24  4:38 AM  Result Value Ref Range   WBC 5.4 4.0 - 10.5 K/uL   RBC 4.23 3.87 - 5.11 MIL/uL   Hemoglobin 9.6 (L) 12.0 - 15.0 g/dL   HCT 69.5 (L) 63.9 - 53.9 %   MCV 71.9 (L) 80.0 - 100.0 fL   MCH 22.7 (L) 26.0 - 34.0  pg    MCHC 31.6 30.0 - 36.0 g/dL   RDW 84.8 88.4 - 84.4 %   Platelets 238 150 - 400 K/uL   nRBC 0.0 0.0 - 0.2 %    Comment: Performed at [facility], [address] [place], [place] [address]

## 2024-04-29 NOTE — Plan of Care (Signed)

## 2024-04-29 NOTE — Care Plan (Signed)
 Received call from GYN Dr. Ozon, who states she most likely be having postmenopausal bleeding.  There is no documentation of rectovaginal fistula on the CT A/P.  GYN recommended outpatient follow-up if she continues to bleed.  Gyn signed off.

## 2024-04-29 NOTE — Progress Notes (Signed)
 PROGRESS NOTE    [name]  FMW:[id number] DOB: [birth year] DOA: 04/25/2024 PCP: Sim Emery CROME, MD   Brief Narrative:  This 89 yrs-old Female with PMH significant for rectovaginal fistula, HTN, HLD, osteoarthritis and anemia presented to ED with bleeding. She was noted to have blood trickling down her legs on Friday.  Bleeding subsided but she noted blood when she wiped after urination on Saturday, and she came to ED.  Not on anticoagulation, antiplatelets or NSAID.  Denies history of hemorrhoids or diverticulosis. Denies any trauma or  fall. She was hypertensive  140/112 in ED.  Slightly tachycardic to 110.  Hgb 12.1 (10.6 in 2022).  Troponin elevated to 269 and 366.  EKG sinus rhythm with prolonged PR but no acute ischemic finding.  CT A/P showed 2.3 cm endometrial wall thickening, fibroid uterus and moderate stool in colon.  Cardiology consulted about elevated troponin and BP recommended trending troponin, TTE and consulting again if abnormal finding on TTE.  Hgb stable at about 9.6 after initial drop.  BRBPR noted during peri care by RN the morning of 10/21.  GI consulted.   Blood pressure improved.  TTE without significant finding. GYN consulted.  Assessment & Plan:   Principal Problem:   Hypertensive urgency Active Problems:   Elevated troponin   Rectovaginal fistula with involvement of anal sphincter   Gross hematuria   Increased endometrial stripe thickness   Hypertensive urgency:  > Improving SBP was as high as 240 in ED. Blood pressure has improved now.   Patient was reassured  that BP meds will not cause bleeding.   In fact they will decrease bleeding from uncontrolled hypertension. Decreased clonidine  to 0.2 mg 3 times daily Continue losartan 100 mg daily. Added holding parameters.   Hematochezia /rectal bleed:  Blood noted during exam by EDP but source was not clear.   CT A/P with endometrial wall thickening and fibroid uterus.   Hemoglobin dropped from  12.1>9.7 but her Hgb was 10.6 about 3 years ago.   UA with small Hgb and 6-10 RBC on hpf.   Not on AC, antiplatelets or NSAIDs.   RN noted BRBPR on 10/21.  Anemia panel with some iron deficiency. Normalize BP as above GI consulted , recommended GYN evaluation given history of rectovaginal fistula. Gyn, Dr. Marilynn consulted and will see patient Continue MiraLAX for possible constipation  Elevated troponin/type II MI:  Likely due to uncontrolled BP.  EKG without acute ischemic finding.   Troponin trended from 269 and peaked at 393.  TTE without significant finding.  Patient without cardiopulmonary symptoms Normalize BP as above.   Microcytic iron deficiency anemia:  Hgb 12.1 on admission.  Unknown b/l but 10.6 about 3 years ago Management as above Start PO iron on discharge.   Endometrial wall thickening/uterine fibroid: Outpatient follow-up with gynecology. Gyn, Dr. Ozan consulted and will see patient   Osteoarthritis/generalized weakness:  Significant pain in both hands. -Tylenol  as needed -Added Voltaren  gel for hands. -PT/OT eval   History of rectovaginal fistula:  Reportedly diagnosed in 2007 during colonoscopy - GYN evaluation as above.   Hypokalemia: Replaced.  Continue to monitor.   Generalized weakness/physical deconditioning:  Patient lives alone. - Therapy recommended SNF.  Patient in agreement.    DVT prophylaxis: Lovenox Code Status: DNR  Family Communication: No family at bed side. Disposition Plan:    Status is: Inpatient Remains inpatient appropriate because: Severity of illness.    Consultants:  GI GYN  Procedures:CT A/P  Antimicrobials:  Anti-infectives (From admission, onward)    None      Subjective: Patient was seen and examined at bedside.  Overnight events noted. Patient reports still having bleeding, does not know the source.  She denies any rectal bleeding. She is being evaluated by gynecology today.   Objective: Vitals:    04/28/24 2040 04/28/24 2352 04/29/24 0508 04/29/24 0738  BP: (!) 143/65 (!) 172/71 (!) 164/68 (!) 145/51  Pulse: 73 74 68 69  Resp:      Temp: 97.7 F (36.5 C) 97.8 F (36.6 C) 97.9 F (36.6 C) 97.8 F (36.6 C)  TempSrc:    Oral  SpO2: 97% 100% 100% 100%    Intake/Output Summary (Last 24 hours) at 04/29/2024 1150 Last data filed at 04/28/2024 1746 Gross per 24 hour  Intake 300 ml  Output 175 ml  Net 125 ml   There were no vitals filed for this visit.  Examination:  General exam: Appears calm and comfortable, not in any acute distress. Respiratory system: CTA Bilaterally . Respiratory effort normal.  RR 14 Cardiovascular system: S1 & S2 heard, RRR. No JVD, murmurs, rubs, gallops or clicks.  Gastrointestinal system: Abdomen is non distended, soft and non tender.  Normal bowel sounds heard. Central nervous system: Alert and oriented x 3. No focal neurological deficits. Extremities: No edema, no cyanosis, no clubbing. Skin: No rashes, lesions or ulcers Psychiatry: Judgement and insight appear normal. Mood & affect appropriate.   Data Reviewed: I have personally reviewed following labs and imaging studies  CBC: Recent Labs  Lab 04/25/24 1419 04/26/24 0324 04/27/24 0449 04/28/24 0438  WBC 6.9 8.0 6.2 5.4  HGB 12.1 10.2* 9.7* 9.6*  HCT 39.5 32.4* 30.2* 30.4*  MCV 74.4* 71.7* 71.4* 71.9*  PLT 252 236 223 238   Basic Metabolic Panel: Recent Labs  Lab 04/25/24 1507 04/26/24 0324 04/26/24 0521 04/27/24 0449 04/28/24 0438  NA 139 141  --  139 139  K 3.6 3.3*  --  4.3 4.0  CL 107 106  --  108 109  CO2 18* 21*  --  22 22  GLUCOSE 99 114*  --  103* 100*  BUN 16 14  --  22 27*  CREATININE 0.79 0.81  --  1.08* 1.13*  CALCIUM 10.5* 10.1  --  9.6 9.7  MG  --   --  2.1 2.1 2.3  PHOS  --   --   --  3.2 3.9   GFR: CrCl cannot be calculated (Unknown ideal weight.). Liver Function Tests: Recent Labs  Lab 04/27/24 0449 04/28/24 0438  ALBUMIN 3.3* 3.2*   No results  for input(s): LIPASE, AMYLASE in the last 168 hours. No results for input(s): AMMONIA in the last 168 hours. Coagulation Profile: No results for input(s): INR, PROTIME in the last 168 hours. Cardiac Enzymes: No results for input(s): CKTOTAL, CKMB, CKMBINDEX, TROPONINI in the last 168 hours. BNP (last 3 results) No results for input(s): PROBNP in the last 8760 hours. HbA1C: No results for input(s): HGBA1C in the last 72 hours. CBG: No results for input(s): GLUCAP in the last 168 hours. Lipid Profile: No results for input(s): CHOL, HDL, LDLCALC, TRIG, CHOLHDL, LDLDIRECT in the last 72 hours. Thyroid Function Tests: No results for input(s): TSH, T4TOTAL, FREET4, T3FREE, THYROIDAB in the last 72 hours. Anemia Panel: Recent Labs    04/26/24 1533  VITAMINB12 >4,000*   Sepsis Labs: No results for input(s): PROCALCITON, LATICACIDVEN in the last 168 hours.  No results found for this  or any previous visit (from the past 240 hours).   Radiology Studies: No results found.  Scheduled Meds:  cloNIDine   0.2 mg Oral Q8H   diclofenac  Sodium  2 g Topical QID   enoxaparin (LOVENOX) injection  40 mg Subcutaneous Q24H   Ferrous Fumarate  318 mg of iron Oral Q breakfast   losartan  100 mg Oral Daily   polyethylene glycol  17 g Oral Daily   Continuous Infusions:   LOS: 4 days    Time spent: 50 mins    Darcel Dawley, MD [facility]   If 7PM-7AM, please contact night-coverage

## 2024-04-29 NOTE — Progress Notes (Signed)
 Physical Therapy Treatment Patient Details Name: [name] MRN: [id number] DOB: Mar 03, 1934 Today's Date: 04/29/2024   History of Present Illness 89 y.o. female who presented to the ED 10/18 for evaluation of hematuria.  HR 90-110s, SBP 170-230s, with up trending troponin. CT abdomen and pelvis with endometrial wall thickening and fibroid uterus. Admitted for treatment of HTN urgency and bleeding. EFY:lwrnwumnoozi hypertension, hyperlipidemia, osteoarthritis, rectovaginal fistula, anemia and insomnia    PT Comments  Pt is making slowed progress towards her goals, continues to have difficulty with sit to stand due to decreased knee flexion. Pt is contact guard for bed mobility, but needs modA for powering up from a significantly elevated sitting position. Pt is min A for ambulation however is unable to bring her trunk to upright. MD in room at end of session. D/c plans remain appropriate. PT will continue to follow acutely.    If plan is discharge home, recommend the following: Two people to help with walking and/or transfers;Two people to help with bathing/dressing/bathroom;Assistance with cooking/housework;Direct supervision/assist for medications management;Direct supervision/assist for financial management;Assist for transportation;Help with stairs or ramp for entrance;Supervision due to cognitive status   Can travel by private vehicle     No  Equipment Recommendations  Hospital bed;Wheelchair (measurements PT);Wheelchair cushion (measurements PT)       Precautions / Restrictions Precautions Precautions: Fall Precaution/Restrictions Comments: impaired B knee flexion Restrictions Weight Bearing Restrictions Per Provider Order: No     Mobility  Bed Mobility Overal bed mobility: Needs Assistance Bed Mobility: Supine to Sit, Sit to Supine     Supine to sit: HOB elevated, Used rails, Contact guard Sit to supine: Contact guard assist   General bed mobility comments:  increased time and use of bed rail to come to EoB    Transfers Overall transfer level: Needs assistance Equipment used: Rolling walker (2 wheels) Transfers: Sit to/from Stand Sit to Stand: Mod assist, From elevated surface           General transfer comment: 1x attempt from regular height bed and pt unable to get her legs under her, ultimately needs mod A from very elevated bed due to inability to flex knees, multimodal cues given, similar results with getting up from [facility] over the commode, sllightly better as she has arm rests to pull up on    Ambulation/Gait Ambulation/Gait assistance: Min assist Gait Distance (Feet): 8 Feet (x2) Assistive device: Rolling walker (2 wheels) Gait Pattern/deviations: Step-through pattern Gait velocity: slowed Gait velocity interpretation: <1.31 ft/sec, indicative of household ambulator   General Gait Details: once up pt moves with very slowed gait and almost entirely flexed posture, pt is unable to come to fully upright. despite maximal multimodal cuing          Balance Overall balance assessment: Needs assistance Sitting-balance support: No upper extremity supported, Feet supported Sitting balance-Leahy Scale: Fair     Standing balance support: Bilateral upper extremity supported, During functional activity Standing balance-Leahy Scale: Poor                              Communication Communication Communication: No apparent difficulties  Cognition Arousal: Alert Behavior During Therapy: WFL for tasks assessed/performed   PT - Cognitive impairments: Safety/Judgement                       PT - Cognition Comments: still thinks she is getting around just fine Following commands: Intact  Cueing Cueing Techniques: Verbal cues, Tactile cues  Exercises General Exercises - Lower Extremity Ankle Circles/Pumps: Left, 5 reps, AROM, Seated    General Comments General comments (skin integrity, edema, etc.): VSS on  RA      Pertinent Vitals/Pain Pain Assessment Pain Assessment: No/denies pain Faces Pain Scale: Hurts a little bit Pain Location: B hands and knees due to arthritis Pain Descriptors / Indicators: Sore Pain Intervention(s): Limited activity within patient's tolerance, Monitored during session, Repositioned     PT Goals (current goals can now be found in the care plan section) Acute Rehab PT Goals Patient Stated Goal: go home PT Goal Formulation: With patient Time For Goal Achievement: 05/11/24 Potential to Achieve Goals: Fair Progress towards PT goals: Progressing toward goals    Frequency    Min 2X/week       AM-PAC PT 6 Clicks Mobility   Outcome Measure  Help needed turning from your back to your side while in a flat bed without using bedrails?: A Little Help needed moving from lying on your back to sitting on the side of a flat bed without using bedrails?: A Lot Help needed moving to and from a bed to a chair (including a wheelchair)?: Total Help needed standing up from a chair using your arms (e.g., wheelchair or bedside chair)?: Total Help needed to walk in hospital room?: A Little Help needed climbing 3-5 steps with a railing? : Total 6 Click Score: 11    End of Session Equipment Utilized During Treatment: Gait belt Activity Tolerance: Patient tolerated treatment well Patient left: in bed;with call bell/phone within reach;with bed alarm set Nurse Communication: Mobility status PT Visit Diagnosis: Unsteadiness on feet (R26.81);Repeated falls (R29.6);History of falling (Z91.81);Muscle weakness (generalized) (M62.81);Difficulty in walking, not elsewhere classified (R26.2)     Time: 9084-9064 PT Time Calculation (min) (ACUTE ONLY): 20 min  Charges:    $Therapeutic Activity: 8-22 mins PT General Charges $$ ACUTE PT VISIT: 1 Visit                     Kellen Dutch B. Osborn Lapidus PT, DPT Acute Rehabilitation Services Please use secure chat or  Call Office [phone]    [name] [facility] 04/29/2024, 9:44 AM

## 2024-04-29 NOTE — Plan of Care (Signed)
 Patient calm and cooperative A&O X3. Patient used bedside commode once. Patient left with call bell in reach and bed in lowest position.  Problem: Education: Goal: Knowledge of General Education information will improve Description: Including pain rating scale, medication(s)/side effects and non-pharmacologic comfort measures Outcome: Progressing   Problem: Health Behavior/Discharge Planning: Goal: Ability to manage health-related needs will improve Outcome: Progressing   Problem: Activity: Goal: Risk for activity intolerance will decrease Outcome: Progressing   Problem: Nutrition: Goal: Adequate nutrition will be maintained Outcome: Progressing   Problem: Coping: Goal: Level of anxiety will decrease Outcome: Progressing   Problem: Pain Managment: Goal: General experience of comfort will improve and/or be controlled Outcome: Progressing   Problem: Safety: Goal: Ability to remain free from injury will improve Outcome: Progressing

## 2024-04-30 DIAGNOSIS — I160 Hypertensive urgency: Secondary | ICD-10-CM | POA: Diagnosis not present

## 2024-04-30 LAB — BASIC METABOLIC PANEL WITH GFR
Anion gap: 9 (ref 5–15)
BUN: 29 mg/dL — ABNORMAL HIGH (ref 8–23)
CO2: 21 mmol/L — ABNORMAL LOW (ref 22–32)
Calcium: 9.4 mg/dL (ref 8.9–10.3)
Chloride: 108 mmol/L (ref 98–111)
Creatinine, Ser: 1.09 mg/dL — ABNORMAL HIGH (ref 0.44–1.00)
GFR, Estimated: 49 mL/min — ABNORMAL LOW (ref >60)
Glucose, Bld: 99 mg/dL (ref 70–99)
Potassium: 4.3 mmol/L (ref 3.5–5.1)
Sodium: 138 mmol/L (ref 135–145)

## 2024-04-30 LAB — CBC
HCT: 29.9 % — ABNORMAL LOW (ref 36.0–46.0)
Hemoglobin: 9.2 g/dL — ABNORMAL LOW (ref 12.0–15.0)
MCH: 22.4 pg — ABNORMAL LOW (ref 26.0–34.0)
MCHC: 30.8 g/dL (ref 30.0–36.0)
MCV: 72.7 fL — ABNORMAL LOW (ref 80.0–100.0)
Platelets: 214 K/uL (ref 150–400)
RBC: 4.11 MIL/uL (ref 3.87–5.11)
RDW: 15.1 % (ref 11.5–15.5)
WBC: 5.1 K/uL (ref 4.0–10.5)
nRBC: 0 % (ref 0.0–0.2)

## 2024-04-30 LAB — MAGNESIUM: Magnesium: 2.2 mg/dL (ref 1.7–2.4)

## 2024-04-30 LAB — PHOSPHORUS: Phosphorus: 4.1 mg/dL (ref 2.5–4.6)

## 2024-04-30 NOTE — Progress Notes (Signed)
 Mobility Specialist Progress Note:   04/30/24 1130  Mobility  Activity Ambulated with assistance  Level of Assistance Minimal assist, patient does 75% or more  Assistive Device Front wheel walker  Distance Ambulated (ft) 80 ft  Activity Response Tolerated well  Mobility Referral Yes  Mobility visit 1 Mobility  Mobility Specialist Start Time (ACUTE ONLY) 1130  Mobility Specialist Stop Time (ACUTE ONLY) 1140  Mobility Specialist Time Calculation (min) (ACUTE ONLY) 10 min   Pt received in chair, agreeable to mobility session. MinA to stand with RW. Took one seated rest break during session. Returned pt to room, requested assistance to bathroom. Void successful. Noticeable blood in urine. Returned pt to chair, sitting up with all needs met.    Arlyn Bumpus Mobility Specialist Please contact via Special educational needs teacher or  Rehab office at [phone]

## 2024-04-30 NOTE — Plan of Care (Signed)

## 2024-04-30 NOTE — Plan of Care (Signed)

## 2024-04-30 NOTE — Progress Notes (Signed)
 PROGRESS NOTE    [name]  FMW:[id number] DOB: 04-Jul-1934 DOA: 04/25/2024 PCP: Sim Emery CROME, MD   Brief Narrative:  This 89 yrs-old Female with PMH significant for rectovaginal fistula, HTN, HLD, osteoarthritis and anemia presented to ED with bleeding. She was noted to have blood trickling down her legs on Friday.  Bleeding subsided but she noted blood when she wiped after urination on Saturday, and she came to ED.  Not on anticoagulation, antiplatelets or NSAID.  Denies history of hemorrhoids or diverticulosis. Denies any trauma or  fall. She was hypertensive  140/112 in ED.  Slightly tachycardic to 110.  Hgb 12.1 (10.6 in 2022).  Troponin elevated to 269 and 366.  EKG sinus rhythm with prolonged PR but no acute ischemic finding.  CT A/P showed 2.3 cm endometrial wall thickening, fibroid uterus and moderate stool in colon.  Cardiology consulted about elevated troponin and BP recommended trending troponin, TTE and consulting again if abnormal finding on TTE.  Hgb stable at about 9.6 after initial drop.  BRBPR noted during peri care by RN the morning of 10/21.  GI consulted.   Blood pressure improved.  TTE without significant finding. GYN consulted.  Assessment & Plan:   Principal Problem:   Hypertensive urgency Active Problems:   Elevated troponin   Rectovaginal fistula with involvement of anal sphincter   Gross hematuria   Increased endometrial stripe thickness   Hypertensive urgency:  > Improved. SBP was as high as 240 in ED. Blood pressure has improved now.   Decreased clonidine  to 0.2 mg 3 times daily Continue losartan 100 mg daily. Added holding parameters.   Hematochezia /rectal bleed:  Blood noted during exam by EDP but source was not clear.   CT A/P with endometrial wall thickening and fibroid uterus.   Hemoglobin dropped from 12.1>9.7 but her Hgb was 10.6 about 3 years ago.   UA with small Hgb and 6-10 RBC on hpf.   Not on AC, antiplatelets or NSAIDs.    RN noted BRBPR on 10/21.  Anemia panel with some iron deficiency. GI consulted , recommended GYN evaluation given history of rectovaginal fistula. Gyn, Dr. Ozan evaluated the patient, states the bleeding is from endometrium likely postmenopausal bleeding. Bleeding has already significantly reduced.  Recommended outpatient follow-up. Continue MiraLAX for possible constipation.  Elevated troponin / type II MI:  Demand ischemia Likely due to uncontrolled BP.  EKG without acute ischemic finding.   Troponin trended from 269 and peaked at 393.  TTE without significant finding.  Patient without cardiopulmonary symptoms. Normalize BP as above.   Microcytic iron deficiency anemia:  Hgb 12.1 on admission.  Unknown b/l but 10.6 about 3 years ago Management as above. Start PO iron on discharge.   Endometrial wall thickening/uterine fibroid: Outpatient follow-up with gynecology. Gyn, Dr. Ozan evaluated the patient and advised outpatient follow-up. Due to severe arthritis patient unable to bend knees.   Patient needs outpatient endometrial biopsy.   Osteoarthritis/generalized weakness:  Significant pain in both hands. -Tylenol  as needed -Added Voltaren  gel for hands. -PT/OT eval   History of rectovaginal fistula:  Reportedly diagnosed in 2007 during colonoscopy CTA/P does not show any evidence of fistula.   Hypokalemia: Replaced.  Continue to monitor.   Generalized weakness/physical deconditioning :  Patient lives alone. - Therapy recommended SNF.  Patient in agreement.    DVT prophylaxis: Lovenox Code Status: DNR  Family Communication: No family at bed side. Disposition Plan:    Status is: Inpatient Remains inpatient appropriate  because: Patient seems close to discharge.  Anticipated discharge home on 05/01/24  if H&H remains stable.  Consultants:  GI GYN  Procedures:CT A/P  Antimicrobials:  Anti-infectives (From admission, onward)    None       Subjective: Patient was seen and examined at bedside. Overnight events noted. Patient reports still having bleeding, but much reduced..  She denies any rectal bleeding.  Objective: Vitals:   04/30/24 0008 04/30/24 0038 04/30/24 0500 04/30/24 0840  BP: (!) 151/92 129/64 (!) 165/57 (!) 143/64  Pulse: 73 63 69 70  Resp: 18 17 18    Temp: 98 F (36.7 C) 97.8 F (36.6 C) 97.9 F (36.6 C) 98.7 F (37.1 C)  TempSrc: Oral Oral Oral   SpO2: 100% 99% 100% 99%   No intake or output data in the 24 hours ending 04/30/24 1117  There were no vitals filed for this visit.  Examination:  General exam: Appears calm and comfortable, not in any acute distress. Respiratory system: CTA Bilaterally . Respiratory effort normal.  RR 14 Cardiovascular system: S1 & S2 heard, RRR. No JVD, murmurs, rubs, gallops or clicks.  Gastrointestinal system: Abdomen is non distended, soft and non tender.  Normal bowel sounds heard. Central nervous system: Alert and oriented x 3. No focal neurological deficits. Extremities: No edema, no cyanosis, no clubbing. Skin: No rashes, lesions or ulcers Psychiatry: Judgement and insight appear normal. Mood & affect appropriate.   Data Reviewed: I have personally reviewed following labs and imaging studies  CBC: Recent Labs  Lab 04/25/24 1419 04/26/24 0324 04/27/24 0449 04/28/24 0438 04/30/24 0217  WBC 6.9 8.0 6.2 5.4 5.1  HGB 12.1 10.2* 9.7* 9.6* 9.2*  HCT 39.5 32.4* 30.2* 30.4* 29.9*  MCV 74.4* 71.7* 71.4* 71.9* 72.7*  PLT 252 236 223 238 214   Basic Metabolic Panel: Recent Labs  Lab 04/25/24 1507 04/26/24 0324 04/26/24 0521 04/27/24 0449 04/28/24 0438 04/30/24 0217  NA 139 141  --  139 139 138  K 3.6 3.3*  --  4.3 4.0 4.3  CL 107 106  --  108 109 108  CO2 18* 21*  --  22 22 21*  GLUCOSE 99 114*  --  103* 100* 99  BUN 16 14  --  22 27* 29*  CREATININE 0.79 0.81  --  1.08* 1.13* 1.09*  CALCIUM 10.5* 10.1  --  9.6 9.7 9.4  MG  --   --  2.1 2.1 2.3  2.2  PHOS  --   --   --  3.2 3.9 4.1   GFR: CrCl cannot be calculated (Unknown ideal weight.). Liver Function Tests: Recent Labs  Lab 04/27/24 0449 04/28/24 0438  ALBUMIN 3.3* 3.2*   No results for input(s): LIPASE, AMYLASE in the last 168 hours. No results for input(s): AMMONIA in the last 168 hours. Coagulation Profile: No results for input(s): INR, PROTIME in the last 168 hours. Cardiac Enzymes: No results for input(s): CKTOTAL, CKMB, CKMBINDEX, TROPONINI in the last 168 hours. BNP (last 3 results) No results for input(s): PROBNP in the last 8760 hours. HbA1C: No results for input(s): HGBA1C in the last 72 hours. CBG: No results for input(s): GLUCAP in the last 168 hours. Lipid Profile: No results for input(s): CHOL, HDL, LDLCALC, TRIG, CHOLHDL, LDLDIRECT in the last 72 hours. Thyroid Function Tests: No results for input(s): TSH, T4TOTAL, FREET4, T3FREE, THYROIDAB in the last 72 hours. Anemia Panel: No results for input(s): VITAMINB12, FOLATE, FERRITIN, TIBC, IRON, RETICCTPCT in the last 72 hours.  Sepsis Labs: No results for input(s): PROCALCITON, LATICACIDVEN in the last 168 hours.  No results found for this or any previous visit (from the past 240 hours).   Radiology Studies: No results found.  Scheduled Meds:  cloNIDine   0.2 mg Oral Q8H   diclofenac  Sodium  2 g Topical QID   enoxaparin (LOVENOX) injection  40 mg Subcutaneous Q24H   Ferrous Fumarate  318 mg of iron Oral Q breakfast   losartan  100 mg Oral Daily   polyethylene glycol  17 g Oral Daily   Continuous Infusions:   LOS: 5 days    Time spent: 35 mins    Darcel Dawley, MD [facility]   If 7PM-7AM, please contact night-coverage

## 2024-04-30 NOTE — TOC Initial Note (Signed)
 Transition of Care [facility]) - Initial/Assessment Note    Patient Details  Name: [name] MRN: [id number] Date of Birth: Mar 24, 1934  [facility]) CM/SW Contact:    Sherline Clack, LCSWA Phone Number: 04/30/2024, 3:59 PM  Clinical Narrative:                  CSW spoke with family and patient regarding skilled nursing facility bed offers. CSW presented facility list with Medicare ratings and family chose [facility] for discharge. CSW will submit insurance authorization and will continue to follow.   Expected Discharge Plan: Skilled Nursing Facility Barriers to Discharge: Insurance Authorization   Patient Goals and CMS Choice            Expected Discharge Plan and Services       Living arrangements for the past 2 months: Single Family Home                                      Prior Living Arrangements/Services Living arrangements for the past 2 months: Single Family Home Lives with:: Self, Adult Children Patient language and need for interpreter reviewed:: Yes                 Activities of Daily Living      Permission Sought/Granted                  Emotional Assessment       Orientation: : Oriented to Self, Oriented to Place, Oriented to Situation, Oriented to  Time      Admission diagnosis:  Elevated troponin [R79.89] Hypertensive urgency [I16.0] Uncontrolled hypertension [I10] Rectovaginal fistula with involvement of anal sphincter [N82.3] Patient Active Problem List   Diagnosis Date Noted   Elevated troponin 04/26/2024   Rectovaginal fistula with involvement of anal sphincter 04/26/2024   Gross hematuria 04/26/2024   Increased endometrial stripe thickness 04/26/2024   Hypertensive urgency 04/25/2024   Right carpal tunnel syndrome 03/01/2022   Left carpal tunnel syndrome 03/01/2022   Pain in both hands 08/14/2020   Primary osteoarthritis of both hips 08/14/2020   Primary osteoarthritis of both  knees 08/14/2020   DDD (degenerative disc disease), lumbar 08/14/2020   EAR PAIN, RIGHT 09/02/2007   POSTMENOPAUSAL STATUS 03/28/2007   HYPERLIPIDEMIA 05/27/2006   ANEMIA, MICROCYTIC 05/27/2006   HYPERTENSION 05/27/2006   ALLERGIC RHINITIS 05/27/2006   RECTOVAGINAL FISTULA 05/27/2006   OSTEOARTHRITIS 05/27/2006   HAMSTRING TENDINITIS 05/27/2006   OSTEOPENIA 05/27/2006   INSOMNIA 05/27/2006   PCP:  Sim Emery CROME, MD Pharmacy:   [facility] [phone] - [place], [place] - [address] AT [address] [place] [address] Phone: [phone] Fax: [phone]     Social Drivers of Health (SDOH) Social History: SDOH Screenings   Food Insecurity: No Food Insecurity (04/26/2024)  Housing: Low Risk  (04/26/2024)  Transportation Needs: No Transportation Needs (04/26/2024)  Utilities: Not At Risk (04/26/2024)  Social Connections: Moderately Integrated (04/26/2024)  Tobacco Use: Low Risk  (04/25/2024)   SDOH Interventions:     Readmission Risk Interventions    04/27/2024    2:13 PM  Readmission Risk Prevention Plan  Post Dischage Appt Complete  Medication Screening Complete  Transportation Screening Complete

## 2024-05-01 DIAGNOSIS — I160 Hypertensive urgency: Secondary | ICD-10-CM | POA: Diagnosis not present

## 2024-05-01 LAB — HEMOGLOBIN AND HEMATOCRIT, BLOOD
HCT: 28.1 % — ABNORMAL LOW (ref 36.0–46.0)
Hemoglobin: 8.7 g/dL — ABNORMAL LOW (ref 12.0–15.0)

## 2024-05-01 MED ORDER — ORAL CARE MOUTH RINSE: 15.0000 mL | OROMUCOSAL | Status: DC | PRN

## 2024-05-01 NOTE — Plan of Care (Signed)

## 2024-05-01 NOTE — Progress Notes (Signed)
 Mobility Specialist: Progress Note   05/01/24 1500  Mobility  Activity Ambulated with assistance  Level of Assistance Contact guard assist, steadying assist  Assistive Device Front wheel walker  Distance Ambulated (ft) 50 ft (x2)  Activity Response Tolerated well  Mobility Referral Yes  Mobility visit 1 Mobility  Mobility Specialist Start Time (ACUTE ONLY) 1130  Mobility Specialist Stop Time (ACUTE ONLY) 1142  Mobility Specialist Time Calculation (min) (ACUTE ONLY) 12 min    Pt received in chair, agreeable to mobility session. CGA throughout. Ambulated 2 bouts of 50' with a seated rest in between. No complaints. Returned to room without fault. Left in chair with all needs met, call bell in reach.  Ileana Lute Mobility Specialist Please contact via SecureChat or Rehab office at [phone]

## 2024-05-01 NOTE — Progress Notes (Signed)
 Occupational Therapy Treatment Patient Details Name: [name] MRN: [id number] DOB: Oct 24, 1933 Today's Date: 05/01/2024   History of present illness 89 y.o. female who presented to the ED 10/18 for evaluation of hematuria.  HR 90-110s, SBP 170-230s, with up trending troponin. CT abdomen and pelvis with endometrial wall thickening and fibroid uterus. Admitted for treatment of HTN urgency and bleeding. EFY:lwrnwumnoozi hypertension, hyperlipidemia, osteoarthritis, rectovaginal fistula, anemia and insomnia   OT comments  Patient received in recliner, pleasant, and eager to participate with OT treatment. Patient demonstrating good gains with OT treatment with patient able to stand at sink for grooming and peri area bathing with seated rest breaks and mod assist to stand and min assist for tasks. Patient performed mobility in room to address toilet transfers with min assist for balance and cues for safety. Patient will benefit from continued inpatient follow up therapy, <3 hours/day.  Acute OT to continue to follow to address established goals to facilitate DC to next venue of care.        If plan is discharge home, recommend the following:  A little help with walking and/or transfers;A little help with bathing/dressing/bathroom;Assistance with Charity fundraiser (measurements OT);Wheelchair cushion (measurements OT) (if does not already have)    Recommendations for Other Services      Precautions / Restrictions Precautions Precautions: Fall Precaution/Restrictions Comments: impaired B knee flexion Restrictions Weight Bearing Restrictions Per Provider Order: No       Mobility Bed Mobility Overal bed mobility: Needs Assistance             General bed mobility comments: OOB in recliner    Transfers Overall transfer level: Needs assistance Equipment used: Rolling walker (2 wheels) Transfers: Sit to/from Stand, Bed to  chair/wheelchair/BSC Sit to Stand: Mod assist, From elevated surface           General transfer comment: mod assist to stand from recliner with cues for hand placement     Balance Overall balance assessment: Needs assistance Sitting-balance support: No upper extremity supported, Feet supported Sitting balance-Leahy Scale: Fair     Standing balance support: Single extremity supported, Bilateral upper extremity supported, During functional activity Standing balance-Leahy Scale: Poor Standing balance comment: reliant on RW or sink for support                           ADL either performed or assessed with clinical judgement   ADL Overall ADL's : Needs assistance/impaired     Grooming: Wash/dry hands;Wash/dry face;Oral care;Minimal assistance;Standing Grooming Details (indicate cue type and reason): at sink Upper Body Bathing: Set up;Sitting   Lower Body Bathing: Minimal assistance;Sit to/from stand Lower Body Bathing Details (indicate cue type and reason): for peri area bathing while standing Upper Body Dressing : Set up;Sitting Upper Body Dressing Details (indicate cue type and reason): gown change Lower Body Dressing: Moderate assistance;Sitting/lateral leans Lower Body Dressing Details (indicate cue type and reason): discussed AE use for LB dressing Toilet Transfer: Minimal assistance;Moderate assistance;Ambulation;Rolling walker (2 wheels) Toilet Transfer Details (indicate cue type and reason): simulated           General ADL Comments: could benefit from further AE training    Extremity/Trunk Assessment              Vision       Perception     Praxis     Communication Communication Communication: No apparent difficulties   Cognition Arousal: Alert Behavior  During Therapy: WFL for tasks assessed/performed Cognition: No apparent impairments                               Following commands: Intact        Cueing   Cueing  Techniques: Verbal cues, Tactile cues  Exercises      Shoulder Instructions       General Comments limited knee flexion    Pertinent Vitals/ Pain       Pain Assessment Pain Assessment: Faces Faces Pain Scale: Hurts a little bit Pain Location: BUE hands and knees due to arthritis Pain Descriptors / Indicators: Sore Pain Intervention(s): Limited activity within patient's tolerance, Monitored during session, Repositioned  Home Living                                          Prior Functioning/Environment              Frequency  Min 2X/week        Progress Toward Goals  OT Goals(current goals can now be found in the care plan section)  Progress towards OT goals: Progressing toward goals  Acute Rehab OT Goals Patient Stated Goal: to get stronger OT Goal Formulation: With patient Time For Goal Achievement: 05/11/24 Potential to Achieve Goals: Good ADL Goals Pt Will Perform Lower Body Dressing: with modified independence;with adaptive equipment;sit to/from stand;sitting/lateral leans Pt Will Transfer to Toilet: with contact guard assist;ambulating Additional ADL Goal #1: Pt to demo ability to stand > 5 min during ADLs/mobility without need for seated rest break  Plan      Co-evaluation                 AM-PAC OT 6 Clicks Daily Activity     Outcome Measure   Help from another person eating meals?: A Little Help from another person taking care of personal grooming?: A Little Help from another person toileting, which includes using toliet, bedpan, or urinal?: A Little Help from another person bathing (including washing, rinsing, drying)?: A Little Help from another person to put on and taking off regular upper body clothing?: A Little Help from another person to put on and taking off regular lower body clothing?: A Little 6 Click Score: 18    End of Session Equipment Utilized During Treatment: Gait belt;Rolling walker (2 wheels)  OT  Visit Diagnosis: Unsteadiness on feet (R26.81);Other abnormalities of gait and mobility (R26.89);Muscle weakness (generalized) (M62.81)   Activity Tolerance Patient tolerated treatment well   Patient Left in chair;with call bell/phone within reach;with chair alarm set   Nurse Communication Mobility status        Time: 9057-8991 OT Time Calculation (min): 26 min  Charges: OT General Charges $OT Visit: 1 Visit OT Treatments $Self Care/Home Management : 8-22 mins $Therapeutic Activity: 8-22 mins  Dick Laine, OTA Acute Rehabilitation Services  Office [phone]   Jeb LITTIE Laine 05/01/2024, 1:48 PM

## 2024-05-01 NOTE — Progress Notes (Signed)
 Gyn update:  Team suggested possibly trying to complete hysteroscopy D and C before patient is discharged from hospital to get endometrial sample.  Discussed briefly with attending through secure chat who agrees.  Pt would need medical/cardiac clearance for anesthesia though.  Spoke with patient, and she seems interested in getting the procedure done, but she would like to discuss with family and think about it.  Will check with patient early in AM to get answer and begin surgical planning if needed.   Jerilynn Buddle, MD

## 2024-05-01 NOTE — TOC Progression Note (Signed)
 Transition of Care [facility]) - Progression Note    Patient Details  Name: [name] MRN: [id number] Date of Birth: 1933-09-17  Transition of Care [facility]) CM/SW Contact  Sherline Clack, [place] Phone Number: 05/01/2024, 1:45 PM  Clinical Narrative:     CSW reached out to [place] at [place] to ask about insurance auth status. Shara is still pending at this time. CSW will continue to follow.   Expected Discharge Plan: Skilled Nursing Facility Barriers to Discharge: Insurance Authorization               Expected Discharge Plan and Services       Living arrangements for the past 2 months: Single Family Home                                       Social Drivers of Health (SDOH) Interventions SDOH Screenings   Food Insecurity: No Food Insecurity (04/26/2024)  Housing: Low Risk  (04/26/2024)  Transportation Needs: No Transportation Needs (04/26/2024)  Utilities: Not At Risk (04/26/2024)  Social Connections: Moderately Integrated (04/26/2024)  Tobacco Use: Low Risk  (04/25/2024)    Readmission Risk Interventions    04/27/2024    2:13 PM  Readmission Risk Prevention Plan  Post Dischage Appt Complete  Medication Screening Complete  Transportation Screening Complete

## 2024-05-01 NOTE — Progress Notes (Signed)
 PROGRESS NOTE    [name]  FMW:[id number] DOB: 03-18-1934 DOA: 04/25/2024 PCP: Sim Emery CROME, MD   Brief Narrative:  This 89 yrs-old Female with PMH significant for rectovaginal fistula, HTN, HLD, osteoarthritis and anemia presented to ED with bleeding. She was noted to have blood trickling down her legs on Friday.  Bleeding subsided but she noted blood when she wiped after urination on Saturday, and she came to ED.  Not on anticoagulation, antiplatelets or NSAID.  Denies history of hemorrhoids or diverticulosis. Denies any trauma or  fall. She was hypertensive  140/112 in ED.  Slightly tachycardic to 110.  Hgb 12.1 (10.6 in 2022).  Troponin elevated to 269 and 366.  EKG sinus rhythm with prolonged PR but no acute ischemic finding.  CT A/P showed 2.3 cm endometrial wall thickening, fibroid uterus and moderate stool in colon.  Cardiology consulted about elevated troponin and BP recommended trending troponin, TTE and consulting again if abnormal finding on TTE.  Hgb stable at about 9.6 after initial drop.  BRBPR noted during peri care by RN the morning of 10/21.  GI consulted.   Blood pressure improved.  TTE without significant finding. GYN consulted.  Assessment & Plan:   Principal Problem:   Hypertensive urgency Active Problems:   Elevated troponin   Rectovaginal fistula with involvement of anal sphincter   Gross hematuria   Increased endometrial stripe thickness   Hypertensive urgency:  > Improved. SBP was as high as 240 in ED. Blood pressure has improved now.   Decreased clonidine  to 0.2 mg 3 times daily Continue losartan 100 mg daily. Added holding parameters.   Hematochezia /rectal bleed:  Blood noted during exam by EDP but source was not clear.   CT A/P with endometrial wall thickening and fibroid uterus.   Hemoglobin dropped from 12.1>9.7 but her Hgb was 10.6 about 3 years ago.   UA with small Hgb and 6-10 RBC on hpf.   Not on AC, antiplatelets or NSAIDs.    RN noted BRBPR on 10/21.  Anemia panel with some iron deficiency. GI consulted , recommended GYN evaluation given history of rectovaginal fistula. Gyn, Dr. Ozan evaluated the patient, states the bleeding is from endometrium likely postmenopausal bleeding. Bleeding has already stopped.  Recommended outpatient follow-up. Continue MiraLAX for possible constipation.  Elevated troponin / type II MI:  Demand ischemia Likely due to uncontrolled BP.  EKG without acute ischemic finding.   Troponin trended from 269 and peaked at 393.  TTE without significant finding.  Patient without cardiopulmonary symptoms. Normalize BP as above.   Microcytic iron deficiency anemia:  Hgb 12.1 on admission.  Unknown b/l but 10.6 about 3 years ago Management as above. Start PO iron on discharge.   Endometrial wall thickening/uterine fibroid: Outpatient follow-up with gynecology. Gyn, Dr. Ozan evaluated the patient and advised outpatient follow-up. Due to severe arthritis patient unable to bend knees.  Needs outpatient follow up. Patient needs outpatient endometrial biopsy.   Osteoarthritis/generalized weakness:  Significant pain in both hands. -Tylenol  as needed -Added Voltaren  gel for hands. -PT/OT eval > SNF pending insurance auth.   History of rectovaginal fistula:  Reportedly diagnosed in 2007 during colonoscopy CTA/P does not show any evidence of fistula.   Hypokalemia: Replaced.  Continue to monitor.   Generalized weakness/physical deconditioning :  Patient lives alone. - Therapy recommended SNF.  Patient in agreement.    DVT prophylaxis: Lovenox Code Status: DNR  Family Communication: No family at bed side. Disposition Plan:  Status is: Inpatient Remains inpatient appropriate because: Patient is medically ready for discharge.  Anticipated discharge to SNF on 05/02/24  if H&H remains stable.  Consultants:  GI GYN  Procedures:CT A/P  Antimicrobials:  Anti-infectives (From  admission, onward)    None      Subjective: Patient was seen and examined at bedside. Overnight events noted. Patient reports bleeding has stopped.  She denies any rectal bleeding. H&H slightly down but stable.  She is awaiting SNF placement,  insurance authorization pending.  Objective: Vitals:   05/01/24 0037 05/01/24 0450 05/01/24 0807 05/01/24 1235  BP: (!) 139/55 (!) 160/59 (!) 129/51 (!) 144/77  Pulse: 68 72 60 86  Resp: 18 16 16 18   Temp: 98.5 F (36.9 C) 98.2 F (36.8 C) 98.7 F (37.1 C) 98.5 F (36.9 C)  TempSrc:  Oral    SpO2: 99% 98% 100% 99%    Intake/Output Summary (Last 24 hours) at 05/01/2024 1300 Last data filed at 04/30/2024 2000 Gross per 24 hour  Intake 200 ml  Output --  Net 200 ml    There were no vitals filed for this visit.  Examination:  General exam: Appears calm and comfortable, not in any acute distress. Respiratory system: CTA Bilaterally . Respiratory effort normal.  RR 14 Cardiovascular system: S1 & S2 heard, RRR. No JVD, murmurs, rubs, gallops or clicks.  Gastrointestinal system: Abdomen is non distended, soft and non tender.  Normal bowel sounds heard. Central nervous system: Alert and oriented x 3. No focal neurological deficits. Extremities: No edema, no cyanosis, no clubbing. Skin: No rashes, lesions or ulcers Psychiatry: Judgement and insight appear normal. Mood & affect appropriate.   Data Reviewed: I have personally reviewed following labs and imaging studies  CBC: Recent Labs  Lab 04/25/24 1419 04/26/24 0324 04/27/24 0449 04/28/24 0438 04/30/24 0217 05/01/24 0156  WBC 6.9 8.0 6.2 5.4 5.1  --   HGB 12.1 10.2* 9.7* 9.6* 9.2* 8.7*  HCT 39.5 32.4* 30.2* 30.4* 29.9* 28.1*  MCV 74.4* 71.7* 71.4* 71.9* 72.7*  --   PLT 252 236 223 238 214  --    Basic Metabolic Panel: Recent Labs  Lab 04/25/24 1507 04/26/24 0324 04/26/24 0521 04/27/24 0449 04/28/24 0438 04/30/24 0217  NA 139 141  --  139 139 138  K 3.6 3.3*  --   4.3 4.0 4.3  CL 107 106  --  108 109 108  CO2 18* 21*  --  22 22 21*  GLUCOSE 99 114*  --  103* 100* 99  BUN 16 14  --  22 27* 29*  CREATININE 0.79 0.81  --  1.08* 1.13* 1.09*  CALCIUM 10.5* 10.1  --  9.6 9.7 9.4  MG  --   --  2.1 2.1 2.3 2.2  PHOS  --   --   --  3.2 3.9 4.1   GFR: CrCl cannot be calculated (Unknown ideal weight.). Liver Function Tests: Recent Labs  Lab 04/27/24 0449 04/28/24 0438  ALBUMIN 3.3* 3.2*   No results for input(s): LIPASE, AMYLASE in the last 168 hours. No results for input(s): AMMONIA in the last 168 hours. Coagulation Profile: No results for input(s): INR, PROTIME in the last 168 hours. Cardiac Enzymes: No results for input(s): CKTOTAL, CKMB, CKMBINDEX, TROPONINI in the last 168 hours. BNP (last 3 results) No results for input(s): PROBNP in the last 8760 hours. HbA1C: No results for input(s): HGBA1C in the last 72 hours. CBG: No results for input(s): GLUCAP in the last  168 hours. Lipid Profile: No results for input(s): CHOL, HDL, LDLCALC, TRIG, CHOLHDL, LDLDIRECT in the last 72 hours. Thyroid Function Tests: No results for input(s): TSH, T4TOTAL, FREET4, T3FREE, THYROIDAB in the last 72 hours. Anemia Panel: No results for input(s): VITAMINB12, FOLATE, FERRITIN, TIBC, IRON, RETICCTPCT in the last 72 hours.  Sepsis Labs: No results for input(s): PROCALCITON, LATICACIDVEN in the last 168 hours.  No results found for this or any previous visit (from the past 240 hours).   Radiology Studies: No results found.  Scheduled Meds:  cloNIDine   0.2 mg Oral Q8H   diclofenac  Sodium  2 g Topical QID   enoxaparin (LOVENOX) injection  40 mg Subcutaneous Q24H   Ferrous Fumarate  318 mg of iron Oral Q breakfast   losartan  100 mg Oral Daily   polyethylene glycol  17 g Oral Daily   Continuous Infusions:   LOS: 6 days    Time spent: 35 mins    Darcel Dawley, MD [facility]   If 7PM-7AM, please contact night-coverage

## 2024-05-02 DIAGNOSIS — I160 Hypertensive urgency: Secondary | ICD-10-CM | POA: Diagnosis not present

## 2024-05-02 DIAGNOSIS — N1831 Chronic kidney disease, stage 3a: Secondary | ICD-10-CM

## 2024-05-02 DIAGNOSIS — R7989 Other specified abnormal findings of blood chemistry: Secondary | ICD-10-CM | POA: Diagnosis not present

## 2024-05-02 LAB — CBC
HCT: 32.5 % — ABNORMAL LOW (ref 36.0–46.0)
Hemoglobin: 10 g/dL — ABNORMAL LOW (ref 12.0–15.0)
MCH: 22.7 pg — ABNORMAL LOW (ref 26.0–34.0)
MCHC: 30.8 g/dL (ref 30.0–36.0)
MCV: 73.9 fL — ABNORMAL LOW (ref 80.0–100.0)
Platelets: 281 K/uL (ref 150–400)
RBC: 4.4 MIL/uL (ref 3.87–5.11)
RDW: 14.9 % (ref 11.5–15.5)
WBC: 6.4 K/uL (ref 4.0–10.5)
nRBC: 0 % (ref 0.0–0.2)

## 2024-05-02 LAB — BASIC METABOLIC PANEL WITH GFR
Anion gap: 12 (ref 5–15)
BUN: 40 mg/dL — ABNORMAL HIGH (ref 8–23)
CO2: 21 mmol/L — ABNORMAL LOW (ref 22–32)
Calcium: 9.9 mg/dL (ref 8.9–10.3)
Chloride: 103 mmol/L (ref 98–111)
Creatinine, Ser: 1.17 mg/dL — ABNORMAL HIGH (ref 0.44–1.00)
GFR, Estimated: 45 mL/min — ABNORMAL LOW (ref >60)
Glucose, Bld: 122 mg/dL — ABNORMAL HIGH (ref 70–99)
Potassium: 4.6 mmol/L (ref 3.5–5.1)
Sodium: 136 mmol/L (ref 135–145)

## 2024-05-02 LAB — HEMOGLOBIN AND HEMATOCRIT, BLOOD
HCT: 27.8 % — ABNORMAL LOW (ref 36.0–46.0)
Hemoglobin: 8.6 g/dL — ABNORMAL LOW (ref 12.0–15.0)

## 2024-05-02 LAB — TYPE AND SCREEN
ABO/RH(D): O POS
Antibody Screen: NEGATIVE

## 2024-05-02 MED ORDER — SODIUM CHLORIDE 0.9 % IV SOLN: INTRAVENOUS | Status: DC

## 2024-05-02 MED ORDER — ACETAMINOPHEN 500 MG PO TABS
1000.0000 mg | ORAL_TABLET | ORAL | Status: AC
  Administered 2024-05-03: 1000 mg via ORAL
  Filled 2024-05-02 (×2): qty 2

## 2024-05-02 MED ORDER — POVIDONE-IODINE 10 % EX SWAB
2.0000 | Freq: Once | CUTANEOUS | Status: AC
  Administered 2024-05-03: 2 via TOPICAL

## 2024-05-02 MED ORDER — SOD CITRATE-CITRIC ACID 500-334 MG/5ML PO SOLN
30.0000 mL | ORAL | Status: AC
  Administered 2024-05-03: 30 mL via ORAL
  Filled 2024-05-02 (×2): qty 30

## 2024-05-02 MED ORDER — MISOPROSTOL 100 MCG PO TABS
100.0000 ug | ORAL_TABLET | Freq: Once | ORAL | Status: AC
  Administered 2024-05-02: 100 ug via VAGINAL
  Filled 2024-05-02: qty 1

## 2024-05-02 NOTE — Consult Note (Signed)
 Cardiology Consultation   Patient ID: ARIAL GALLIGAN MRN: [id number]; DOB: 23-Jun-1934  Admit date: 04/25/2024 Date of Consult: 05/02/2024  PCP:  Sim Emery CROME, MD   [facility] Providers Cardiologist:  None        Patient Profile: [name] is a 89 y.o. female with a hx of  who is being seen 05/02/2024 for the evaluation of hypertension, hyperlipidemia, osteoarthritis, at the request of Dr. Jerilynn Buddle.  History of Present Illness: Ms. [name] presented initially to the hospital to be evaluated for bleeding.  On presentation she was hypertensive and noted to be tachycardic.  During her initial ED evaluation she was found to have troponin peaked at 399 and now trending down. EKG with no acute ischemic finding.  Symptoms recommended that she undergo an echocardiogram.  Transthoracic echocardiogram was normal giving plans for surgery cardiology has been consulted for preoperative clearance.  Patient seen and examined at her bedside. She denies chest pain, shortness of breath.    Past Medical History:  Diagnosis Date   Allergic rhinitis    Allergic rhinitis    Hamstring tendonitis at origin    Right hamstring   Hyperlipidemia    Hypertension    Well controlled.    Insomnia    Microcytic anemia    Baseline about 11.  MCV approx 70. Ferritin 196 in 9/08   Osteoarthritis    Bilateral knees, x ray 6/05:  Right knee- Severe tricompartmental degenerative arthritis with loose bodies.   Osteopenia    Hx, no longer osteopenic,  normal DEXA 11/07   Rectovaginal fistula    Seen during colonoscopy    Past Surgical History:  Procedure Laterality Date   CATARACT EXTRACTION, BILATERAL     KNEE ARTHROSCOPY Bilateral        Scheduled Meds:  cloNIDine   0.2 mg Oral Q8H   diclofenac  Sodium  2 g Topical QID   Ferrous Fumarate  318 mg of iron Oral Q breakfast   losartan  100 mg Oral Daily   misoprostol  100 mcg Vaginal Once   polyethylene glycol   17 g Oral Daily   Continuous Infusions:  PRN Meds: acetaminophen  **OR** acetaminophen , bisacodyl, HYDROcodone-acetaminophen , ondansetron **OR** ondansetron (ZOFRAN) IV, mouth rinse, senna-docusate  Allergies:    Allergies  Allergen Reactions   Penicillins Other (See Comments)    Skin turned purple   Sulfonamide Derivatives Other (See Comments)    swell    Social History:   Social History   Socioeconomic History   Marital status: Widowed    Spouse name: Not on file   Number of children: Not on file   Years of education: Not on file   Highest education level: Not on file  Occupational History   Not on file  Tobacco Use   Smoking status: Never   Smokeless tobacco: Never  Vaping Use   Vaping status: Never Used  Substance and Sexual Activity   Alcohol use: No   Drug use: No   Sexual activity: Not on file  Other Topics Concern   Not on file  Social History Narrative   Not on file   Social Drivers of Health   Financial Resource Strain: Not on file  Food Insecurity: No Food Insecurity (04/26/2024)   Hunger Vital Sign    Worried About Running Out of Food in the Last Year: Never true    Ran Out of Food in the Last Year: Never true  Transportation Needs: No Transportation Needs (04/26/2024)  PRAPARE - Administrator, Civil Service (Medical): No    Lack of Transportation (Non-Medical): No  Physical Activity: Not on file  Stress: Not on file  Social Connections: Moderately Integrated (04/26/2024)   Social Connection and Isolation Panel    Frequency of Communication with Friends and Family: More than three times a week    Frequency of Social Gatherings with Friends and Family: Three times a week    Attends Religious Services: More than 4 times per year    Active Member of Clubs or Organizations: Yes    Attends Banker Meetings: More than 4 times per year    Marital Status: Widowed  Intimate Partner Violence: Not At Risk (04/26/2024)    Humiliation, Afraid, Rape, and Kick questionnaire    Fear of Current or Ex-Partner: No    Emotionally Abused: No    Physically Abused: No    Sexually Abused: No    Family History:    Family History  Problem Relation Age of Onset   Breast cancer Mother    Breast cancer Sister    Cancer Brother    Healthy Son      ROS:  Please see the history of present illness.   All other ROS reviewed and negative.     Physical Exam/Data: Vitals:   05/01/24 2009 05/02/24 0523 05/02/24 0832 05/02/24 1229  BP: (!) 136/46 (!) 132/54 (!) 166/72 (!) 160/77  Pulse: 71 62 68 (!) 103  Resp: 16 18 18 20   Temp: 98 F (36.7 C) 97.6 F (36.4 C) 98 F (36.7 C) 98.1 F (36.7 C)  TempSrc: Oral Oral Oral   SpO2: 100% 99% (!) 68% (!) 88%    Intake/Output Summary (Last 24 hours) at 05/02/2024 1345 Last data filed at 05/01/2024 2300 Gross per 24 hour  Intake 300 ml  Output --  Net 300 ml      03/06/2022    2:52 PM 08/24/2020    9:54 AM 08/02/2020   10:10 AM  Last 3 Weights  Weight (lbs) 170 lb 184 lb 182 lb 6.4 oz  Weight (kg) 77.111 kg 83.462 kg 82.736 kg     There is no height or weight on file to calculate BMI.  General:  Well nourished, well developed, in no acute distress HEENT: normal Neck: no JVD Vascular: No carotid bruits; Distal pulses 2+ bilaterally Cardiac:  normal S1, S2; RRR; no murmur  Lungs:  clear to auscultation bilaterally, no wheezing, rhonchi or rales  Abd: soft, nontender, no hepatomegaly  Ext: no edema Musculoskeletal:  No deformities, BUE and BLE strength normal and equal Skin: warm and dry  Neuro:  CNs 2-12 intact, no focal abnormalities noted Psych:  Normal affect   EKG:  The EKG was personally reviewed and demonstrates:   Telemetry:  Telemetry was personally reviewed and demonstrates:  sinus rhythm   Relevant CV Studies: Echo   Laboratory Data: High Sensitivity Troponin:   Recent Labs  Lab 04/25/24 1655 04/25/24 1943 04/26/24 0324 04/26/24 0521   TROPONINIHS 269* 366* 393* 289*     Chemistry Recent Labs  Lab 04/27/24 0449 04/28/24 0438 04/30/24 0217  NA 139 139 138  K 4.3 4.0 4.3  CL 108 109 108  CO2 22 22 21*  GLUCOSE 103* 100* 99  BUN 22 27* 29*  CREATININE 1.08* 1.13* 1.09*  CALCIUM 9.6 9.7 9.4  MG 2.1 2.3 2.2  GFRNONAA 49* 47* 49*  ANIONGAP 9 8 9     Recent Labs  Lab 04/27/24 0449 04/28/24 0438  ALBUMIN 3.3* 3.2*   Lipids No results for input(s): CHOL, TRIG, HDL, LABVLDL, LDLCALC, CHOLHDL in the last 168 hours.  Hematology Recent Labs  Lab 04/27/24 0449 04/28/24 0438 04/30/24 0217 05/01/24 0156 05/02/24 0151  WBC 6.2 5.4 5.1  --   --   RBC 4.23 4.23 4.11  --   --   HGB 9.7* 9.6* 9.2* 8.7* 8.6*  HCT 30.2* 30.4* 29.9* 28.1* 27.8*  MCV 71.4* 71.9* 72.7*  --   --   MCH 22.9* 22.7* 22.4*  --   --   MCHC 32.1 31.6 30.8  --   --   RDW 15.5 15.1 15.1  --   --   PLT 223 238 214  --   --    Thyroid No results for input(s): TSH, FREET4 in the last 168 hours.  BNPNo results for input(s): BNP, PROBNP in the last 168 hours.  DDimer No results for input(s): DDIMER in the last 168 hours.  Radiology/Studies:  No results found.   Assessment and Plan: Hypertensive heart disease - with hypertensive urgency  Elevated troponin Moderate LVH on echocardiogram Rectal bleeding Microcytic anemia Chronic kidney disease stage 3a  Clinically she does not have any anginal symptoms, troponin I suspect was the the setting acute bleeding, Type 2 mismatch demand ischemia due to her anemia. Her ECG today no acute ischemic changes, echo recently perform with normal EF and no wall motion abnormality. Putting all this together clinically there is no indication for pursing ischemic evaluation.  She is hypertensive , currently on clonidine  0.2 mg every 8 hrs, losartan 100 mg daily.. Will benefit from getting better blood pressure control, she can start Amlodipine 5 mg postoperatively if blood pressure remains  elevated.   In the setting of her CKD avoid nephrotoxins.   The patient does not have any unstable cardiac conditions.  Upon evaluation today, she can achieve 4 METs or greater without anginal symptoms.  According to [facility] and AHA guidelines, she requires no further cardiac workup prior to her noncardiac surgery and should be at acceptable risk.  Our service is available as necessary in the perioperative period.  Please call in the postpartum period if you need further cardiology care.   Risk Assessment/Risk Scores:       For questions or updates, please contact [facility] Please consult [email or web address] for contact info under   Signed, Jadamarie Butson, DO  05/02/2024 1:45 PM

## 2024-05-02 NOTE — H&P (View-Only) (Signed)
 Subjective: Spoke with patient this morning.  She is no longer having any bleeding vaginally, but she is still concerned regarding the thickened stripe and why she had the bleeding in the first place.  Pt understands the only way to get adequate material is likely hysteroscopy with D and C, and she will need general anesthesia for the procedure.    Objective: Vital signs in last 24 hours: Temp:  [97.6 F (36.4 C)-98.5 F (36.9 C)] 98.1 F (36.7 C) (10/25 1229) Pulse Rate:  [58-103] 103 (10/25 1229) Resp:  [16-20] 20 (10/25 1229) BP: (132-166)/(46-77) 160/77 (10/25 1229) SpO2:  [68 %-100 %] 88 % (10/25 1229) Weight change:   Intake/Output from previous day: 10/24 0701 - 10/25 0700 In: 300 [P.O.:300] Out: -  Intake/Output this shift: No intake/output data recorded.   Lab Results: Recent Labs    04/30/24 0217 05/01/24 0156 05/02/24 0151  WBC 5.1  --   --   HGB 9.2* 8.7* 8.6*  HCT 29.9* 28.1* 27.8*  PLT 214  --   --    BMET:  Recent Labs    04/30/24 0217  NA 138  K 4.3  CL 108  CO2 21*  GLUCOSE 99  BUN 29*  CREATININE 1.09*  CALCIUM 9.4    I have reviewed the patient's current medications.  Assessment/Plan: Postmenopausal bleeding Risks and benefits of the procedure given including bleeding, infection, involvement of other organs as well as uterine perforation. I have contacted the medicine attending and communicated the need for medical/cardiac clearance for procedure 05/03/24 at 1100. Pt wll be NPO at midnight if possible. Advise vaginal cytotec for ripening and ultrasound guidance to optimize uterine entry.   LOS: 7 days   Jerilynn DELENA Buddle 05/02/2024,1:06 PM

## 2024-05-02 NOTE — Plan of Care (Signed)

## 2024-05-02 NOTE — Progress Notes (Signed)
 Mobility Specialist: Progress Note   05/02/24 1400  Mobility  Activity Ambulated with assistance  Level of Assistance Contact guard assist, steadying assist  Assistive Device Front wheel walker  Distance Ambulated (ft) 55 ft (x2)  Activity Response Tolerated well  Mobility Referral Yes  Mobility visit 1 Mobility  Mobility Specialist Start Time (ACUTE ONLY) 0930  Mobility Specialist Stop Time (ACUTE ONLY) 0955  Mobility Specialist Time Calculation (min) (ACUTE ONLY) 25 min    Pt received in chair, agreeable to mobility session. CGA throughout. No complaints. Ambulated two bouts of 26' with a seated rest in between. Returned to room without fault. Left in chair with all needs met, call bell in reach.   Ileana Lute Mobility Specialist Please contact via SecureChat or Rehab office at [phone]

## 2024-05-02 NOTE — Progress Notes (Signed)
 Subjective: Spoke with patient this morning.  She is no longer having any bleeding vaginally, but she is still concerned regarding the thickened stripe and why she had the bleeding in the first place.  Pt understands the only way to get adequate material is likely hysteroscopy with D and C, and she will need general anesthesia for the procedure.    Objective: Vital signs in last 24 hours: Temp:  [97.6 F (36.4 C)-98.5 F (36.9 C)] 98.1 F (36.7 C) (10/25 1229) Pulse Rate:  [58-103] 103 (10/25 1229) Resp:  [16-20] 20 (10/25 1229) BP: (132-166)/(46-77) 160/77 (10/25 1229) SpO2:  [68 %-100 %] 88 % (10/25 1229) Weight change:   Intake/Output from previous day: 10/24 0701 - 10/25 0700 In: 300 [P.O.:300] Out: -  Intake/Output this shift: No intake/output data recorded.   Lab Results: Recent Labs    04/30/24 0217 05/01/24 0156 05/02/24 0151  WBC 5.1  --   --   HGB 9.2* 8.7* 8.6*  HCT 29.9* 28.1* 27.8*  PLT 214  --   --    BMET:  Recent Labs    04/30/24 0217  NA 138  K 4.3  CL 108  CO2 21*  GLUCOSE 99  BUN 29*  CREATININE 1.09*  CALCIUM 9.4    I have reviewed the patient's current medications.  Assessment/Plan: Postmenopausal bleeding Risks and benefits of the procedure given including bleeding, infection, involvement of other organs as well as uterine perforation. I have contacted the medicine attending and communicated the need for medical/cardiac clearance for procedure 05/03/24 at 1100. Pt wll be NPO at midnight if possible. Advise vaginal cytotec for ripening and ultrasound guidance to optimize uterine entry.   LOS: 7 days   Jerilynn DELENA Buddle 05/02/2024,1:06 PM

## 2024-05-02 NOTE — TOC Progression Note (Signed)
 Transition of Care [facility]) - Progression Note    Patient Details  Name: [name] MRN: [id number] Date of Birth: Feb 24, 1934  [facility] of [facility]) CM/SW Contact  Gwenn Frieze [place], [place] Phone Number: 05/02/2024, 10:01 AM  Clinical Narrative:  Per [place] at [facility], they received [facility] auth and can admit pt beginning Monday. They do not have bed availability over the weekend.   Frieze Gwenn, MSW, LCSW [phone] (coverage)       Expected Discharge Plan: Skilled Nursing Facility Barriers to Discharge: Insurance Authorization               Expected Discharge Plan and Services       Living arrangements for the past 2 months: Single Family Home                                       Social Drivers of Health (SDOH) Interventions SDOH Screenings   Food Insecurity: No Food Insecurity (04/26/2024)  Housing: Low Risk  (04/26/2024)  Transportation Needs: No Transportation Needs (04/26/2024)  Utilities: Not At Risk (04/26/2024)  Social Connections: Moderately Integrated (04/26/2024)  Tobacco Use: Low Risk  (04/25/2024)    Readmission Risk Interventions    04/27/2024    2:13 PM  Readmission Risk Prevention Plan  Post Dischage Appt Complete  Medication Screening Complete  Transportation Screening Complete

## 2024-05-02 NOTE — Progress Notes (Signed)
 PROGRESS NOTE    [name]  FMW:[id number] DOB: 1934/01/15 DOA: 04/25/2024 PCP: Sim Emery CROME, MD   Brief Narrative:  This 89 yrs-old Female with PMH significant for rectovaginal fistula, HTN, HLD, osteoarthritis and anemia presented to ED with bleeding. She was noted to have blood trickling down her legs on Friday.  Bleeding subsided but she noted blood when she wiped after urination on Saturday, and she came to ED.  Not on anticoagulation, antiplatelets or NSAID.  Denies history of hemorrhoids or diverticulosis. Denies any trauma or  fall. She was hypertensive  140/112 in ED.  Slightly tachycardic to 110.  Hgb 12.1 (10.6 in 2022).  Troponin elevated to 269 and 366.  EKG sinus rhythm with prolonged PR but no acute ischemic finding.  CT A/P showed 2.3 cm endometrial wall thickening, fibroid uterus and moderate stool in colon.  Cardiology consulted about elevated troponin and BP recommended trending troponin, TTE and consulting again if abnormal finding on TTE.  Hgb stable at about 9.6 after initial drop.  BRBPR noted during peri care by RN the morning of 10/21.  GI consulted.   Blood pressure improved.  TTE without significant finding. GYN consulted.  Patient is now scheduled for hysteroscopy with D&C tomorrow.  Needs cardiology clearance  Assessment & Plan:   Principal Problem:   Hypertensive urgency Active Problems:   Elevated troponin   Rectovaginal fistula with involvement of anal sphincter   Gross hematuria   Increased endometrial stripe thickness   Hypertensive urgency:  > Improved. SBP was as high as 240 in ED. Blood pressure has improved now.   Continue clonidine  0.2 mg 3 times daily. Continue losartan 100 mg daily. Added holding parameters.   Hematochezia /rectal bleed:  Blood noted during exam by EDP but source was not clear.   CT A/P with endometrial wall thickening and fibroid uterus.   Hemoglobin dropped from 12.1>9.7 but her Hgb was 10.6 about 3 years  ago.   UA with small Hgb and 6-10 RBC on hpf.   Not on AC, antiplatelets or NSAIDs.   RN noted BRBPR on 10/21.  Anemia panel with some iron deficiency. GI consulted , recommended GYN evaluation given history of rectovaginal fistula. Gyn, Dr. Ozan evaluated the patient, states the bleeding is from endometrium likely postmenopausal bleeding. Bleeding has already stopped.  Recommended outpatient follow-up. Continue MiraLAX for possible constipation.  Elevated troponin / type II MI:  Demand ischemia Likely due to uncontrolled BP.  EKG without acute ischemic finding.   Troponin trended from 269 and peaked at 393.  TTE without significant finding.  Patient without cardiopulmonary symptoms. Normalize BP as above. Cardiology consulted for preop clearance.   Microcytic iron deficiency anemia:  Hgb 12.1 on admission.  Unknown b/l but 10.6 about 3 years ago Management as above. Start PO iron on discharge.   Endometrial wall thickening/uterine fibroid: Gyn, Dr. Ozan evaluated the patient and advised outpatient follow-up. Due to severe arthritis patient unable to bend knees.  Needs outpatient follow up. Patient needs outpatient endometrial biopsy. Patient is now scheduled for hysteroscopy with D&C tomorrow.  Needs cardiology clearance   Osteoarthritis/generalized weakness:  Significant pain in both hands. -Tylenol  as needed -Added Voltaren  gel for hands. -PT/OT eval > SNF pending insurance auth.   History of rectovaginal fistula:  Reportedly diagnosed in 2007 during colonoscopy CTA/P does not show any evidence of fistula.   Hypokalemia: Replaced.  Continue to monitor.   Generalized weakness/physical deconditioning :  Patient lives alone. - Therapy  recommended SNF.  Patient in agreement.    DVT prophylaxis: Lovenox Code Status: DNR  Family Communication: No family at bed side. Disposition Plan:    Status is: Inpatient Remains inpatient appropriate because: Patient is medically  ready for discharge.  Patient is now scheduled for hysteroscopy with D&C tomorrow.  Consultants:  GI GYN  Procedures:CT A/P  Antimicrobials:  Anti-infectives (From admission, onward)    None      Subjective: Patient was seen and examined at bedside. Overnight events noted. Patient reports bleeding has stopped.  She denies any rectal bleeding. H&H slightly down but stable.  She is awaiting SNF placement,  insurance authorization pending.  Objective: Vitals:   05/01/24 2009 05/02/24 0523 05/02/24 0832 05/02/24 1229  BP: (!) 136/46 (!) 132/54 (!) 166/72 (!) 160/77  Pulse: 71 62 68 (!) 103  Resp: 16 18 18 20   Temp: 98 F (36.7 C) 97.6 F (36.4 C) 98 F (36.7 C) 98.1 F (36.7 C)  TempSrc: Oral Oral Oral   SpO2: 100% 99% (!) 68% (!) 88%    Intake/Output Summary (Last 24 hours) at 05/02/2024 1333 Last data filed at 05/01/2024 2300 Gross per 24 hour  Intake 300 ml  Output --  Net 300 ml    There were no vitals filed for this visit.  Examination:  General exam: Appears calm and comfortable, not in any acute distress. Respiratory system: CTA Bilaterally . Respiratory effort normal.  RR 15 Cardiovascular system: S1 & S2 heard, RRR. No JVD, murmurs, rubs, gallops or clicks.  Gastrointestinal system: Abdomen is non distended, soft and non tender.  Normal bowel sounds heard. Central nervous system: Alert and oriented x 3. No focal neurological deficits. Extremities: No edema, no cyanosis, no clubbing. Skin: No rashes, lesions or ulcers Psychiatry: Judgement and insight appear normal. Mood & affect appropriate.   Data Reviewed: I have personally reviewed following labs and imaging studies  CBC: Recent Labs  Lab 04/25/24 1419 04/26/24 0324 04/27/24 0449 04/28/24 0438 04/30/24 0217 05/01/24 0156 05/02/24 0151  WBC 6.9 8.0 6.2 5.4 5.1  --   --   HGB 12.1 10.2* 9.7* 9.6* 9.2* 8.7* 8.6*  HCT 39.5 32.4* 30.2* 30.4* 29.9* 28.1* 27.8*  MCV 74.4* 71.7* 71.4* 71.9*  72.7*  --   --   PLT 252 236 223 238 214  --   --    Basic Metabolic Panel: Recent Labs  Lab 04/25/24 1507 04/26/24 0324 04/26/24 0521 04/27/24 0449 04/28/24 0438 04/30/24 0217  NA 139 141  --  139 139 138  K 3.6 3.3*  --  4.3 4.0 4.3  CL 107 106  --  108 109 108  CO2 18* 21*  --  22 22 21*  GLUCOSE 99 114*  --  103* 100* 99  BUN 16 14  --  22 27* 29*  CREATININE 0.79 0.81  --  1.08* 1.13* 1.09*  CALCIUM 10.5* 10.1  --  9.6 9.7 9.4  MG  --   --  2.1 2.1 2.3 2.2  PHOS  --   --   --  3.2 3.9 4.1   GFR: CrCl cannot be calculated (Unknown ideal weight.). Liver Function Tests: Recent Labs  Lab 04/27/24 0449 04/28/24 0438  ALBUMIN 3.3* 3.2*   No results for input(s): LIPASE, AMYLASE in the last 168 hours. No results for input(s): AMMONIA in the last 168 hours. Coagulation Profile: No results for input(s): INR, PROTIME in the last 168 hours. Cardiac Enzymes: No results for input(s): CKTOTAL,  CKMB, CKMBINDEX, TROPONINI in the last 168 hours. BNP (last 3 results) No results for input(s): PROBNP in the last 8760 hours. HbA1C: No results for input(s): HGBA1C in the last 72 hours. CBG: No results for input(s): GLUCAP in the last 168 hours. Lipid Profile: No results for input(s): CHOL, HDL, LDLCALC, TRIG, CHOLHDL, LDLDIRECT in the last 72 hours. Thyroid Function Tests: No results for input(s): TSH, T4TOTAL, FREET4, T3FREE, THYROIDAB in the last 72 hours. Anemia Panel: No results for input(s): VITAMINB12, FOLATE, FERRITIN, TIBC, IRON, RETICCTPCT in the last 72 hours.  Sepsis Labs: No results for input(s): PROCALCITON, LATICACIDVEN in the last 168 hours.  No results found for this or any previous visit (from the past 240 hours).   Radiology Studies: No results found.  Scheduled Meds:  cloNIDine   0.2 mg Oral Q8H   diclofenac  Sodium  2 g Topical QID   Ferrous Fumarate  318 mg of iron Oral Q breakfast    losartan  100 mg Oral Daily   misoprostol  100 mcg Vaginal Once   polyethylene glycol  17 g Oral Daily   Continuous Infusions:   LOS: 7 days    Time spent: 35 mins    Darcel Dawley, MD [facility]   If 7PM-7AM, please contact night-coverage

## 2024-05-03 ENCOUNTER — Inpatient Hospital Stay (HOSPITAL_COMMUNITY): Payer: Medicare (Managed Care)

## 2024-05-03 ENCOUNTER — Inpatient Hospital Stay (HOSPITAL_COMMUNITY): Payer: Medicare (Managed Care) | Admitting: Anesthesiology

## 2024-05-03 ENCOUNTER — Encounter (HOSPITAL_COMMUNITY): Payer: Self-pay | Admitting: Student

## 2024-05-03 ENCOUNTER — Encounter (HOSPITAL_COMMUNITY)
Admission: EM | Disposition: A | Discharge status: Skilled Nursing Facility | Payer: Self-pay | Source: Home / Self Care | Attending: Family Medicine

## 2024-05-03 DIAGNOSIS — E785 Hyperlipidemia, unspecified: Secondary | ICD-10-CM | POA: Diagnosis not present

## 2024-05-03 DIAGNOSIS — E119 Type 2 diabetes mellitus without complications: Secondary | ICD-10-CM | POA: Diagnosis not present

## 2024-05-03 DIAGNOSIS — N950 Postmenopausal bleeding: Secondary | ICD-10-CM

## 2024-05-03 DIAGNOSIS — I10 Essential (primary) hypertension: Secondary | ICD-10-CM | POA: Diagnosis not present

## 2024-05-03 DIAGNOSIS — I160 Hypertensive urgency: Secondary | ICD-10-CM | POA: Diagnosis not present

## 2024-05-03 HISTORY — PX: DILATION AND CURETTAGE OF UTERUS: SHX78

## 2024-05-03 HISTORY — PX: EXAM UNDER ANESTHESIA, PELVIC: SHX7461

## 2024-05-03 LAB — CBC
HCT: 30.9 % — ABNORMAL LOW (ref 36.0–46.0)
Hemoglobin: 9.6 g/dL — ABNORMAL LOW (ref 12.0–15.0)
MCH: 22.6 pg — ABNORMAL LOW (ref 26.0–34.0)
MCHC: 31.1 g/dL (ref 30.0–36.0)
MCV: 72.7 fL — ABNORMAL LOW (ref 80.0–100.0)
Platelets: 266 K/uL (ref 150–400)
RBC: 4.25 MIL/uL (ref 3.87–5.11)
RDW: 14.8 % (ref 11.5–15.5)
WBC: 4.7 K/uL (ref 4.0–10.5)
nRBC: 0 % (ref 0.0–0.2)

## 2024-05-03 LAB — COMPREHENSIVE METABOLIC PANEL WITH GFR
ALT: 12 U/L (ref 0–44)
AST: 20 U/L (ref 15–41)
Albumin: 3.5 g/dL (ref 3.5–5.0)
Alkaline Phosphatase: 85 U/L (ref 38–126)
Anion gap: 10 (ref 5–15)
BUN: 35 mg/dL — ABNORMAL HIGH (ref 8–23)
CO2: 22 mmol/L (ref 22–32)
Calcium: 10.1 mg/dL (ref 8.9–10.3)
Chloride: 105 mmol/L (ref 98–111)
Creatinine, Ser: 0.99 mg/dL (ref 0.44–1.00)
GFR, Estimated: 55 mL/min — ABNORMAL LOW (ref >60)
Glucose, Bld: 102 mg/dL — ABNORMAL HIGH (ref 70–99)
Potassium: 4.5 mmol/L (ref 3.5–5.1)
Sodium: 137 mmol/L (ref 135–145)
Total Bilirubin: 0.6 mg/dL (ref 0.0–1.2)
Total Protein: 6.8 g/dL (ref 6.5–8.1)

## 2024-05-03 LAB — SURGICAL PCR SCREEN
MRSA, PCR: NEGATIVE
Staphylococcus aureus: NEGATIVE

## 2024-05-03 LAB — GLUCOSE, CAPILLARY: Glucose-Capillary: 188 mg/dL — ABNORMAL HIGH (ref 70–99)

## 2024-05-03 LAB — MAGNESIUM: Magnesium: 2.4 mg/dL (ref 1.7–2.4)

## 2024-05-03 LAB — PHOSPHORUS: Phosphorus: 3.8 mg/dL (ref 2.5–4.6)

## 2024-05-03 SURGERY — DILATION AND CURETTAGE
Anesthesia: General

## 2024-05-03 MED ORDER — HYDRALAZINE HCL 20 MG/ML IJ SOLN: 10.0000 mg | Freq: Three times a day (TID) | INTRAMUSCULAR | Status: DC | PRN

## 2024-05-03 MED ORDER — FENTANYL CITRATE (PF) 250 MCG/5ML IJ SOLN
INTRAMUSCULAR | Status: DC | PRN
  Administered 2024-05-03: 25 ug via INTRAVENOUS

## 2024-05-03 MED ORDER — ORAL CARE MOUTH RINSE: 15.0000 mL | Freq: Once | OROMUCOSAL | Status: AC

## 2024-05-03 MED ORDER — FENTANYL CITRATE (PF) 100 MCG/2ML IJ SOLN
INTRAMUSCULAR | Status: AC
  Filled 2024-05-03: qty 2

## 2024-05-03 MED ORDER — CHLORHEXIDINE GLUCONATE 0.12 % MT SOLN
OROMUCOSAL | Status: AC
  Filled 2024-05-03: qty 15

## 2024-05-03 MED ORDER — LACTATED RINGERS IV SOLN: INTRAVENOUS | Status: DC | PRN

## 2024-05-03 MED ORDER — ONDANSETRON HCL 4 MG/2ML IJ SOLN
INTRAMUSCULAR | Status: DC | PRN
  Administered 2024-05-03: 4 mg via INTRAVENOUS

## 2024-05-03 MED ORDER — CHLORHEXIDINE GLUCONATE 0.12 % MT SOLN
15.0000 mL | Freq: Once | OROMUCOSAL | Status: AC
  Administered 2024-05-03: 15 mL via OROMUCOSAL

## 2024-05-03 MED ORDER — DEXAMETHASONE SOD PHOSPHATE PF 10 MG/ML IJ SOLN
INTRAMUSCULAR | Status: DC | PRN
  Administered 2024-05-03: 10 mg via INTRAVENOUS

## 2024-05-03 MED ORDER — ACETAMINOPHEN 500 MG PO TABS
ORAL_TABLET | ORAL | Status: AC
  Filled 2024-05-03: qty 2

## 2024-05-03 MED ORDER — PROPOFOL 10 MG/ML IV BOLUS
INTRAVENOUS | Status: AC
Start: 2024-05-03 — End: 2024-05-03
  Filled 2024-05-03: qty 20

## 2024-05-03 MED ORDER — HYDRALAZINE HCL 20 MG/ML IJ SOLN
10.0000 mg | Freq: Once | INTRAMUSCULAR | Status: AC
  Administered 2024-05-03: 10 mg via INTRAVENOUS

## 2024-05-03 MED ORDER — LACTATED RINGERS IV SOLN: INTRAVENOUS | Status: DC

## 2024-05-03 MED ORDER — PHENYLEPHRINE 80 MCG/ML (10ML) SYRINGE FOR IV PUSH (FOR BLOOD PRESSURE SUPPORT)
PREFILLED_SYRINGE | INTRAVENOUS | Status: DC | PRN
  Administered 2024-05-03: 80 ug via INTRAVENOUS

## 2024-05-03 MED ORDER — PROPOFOL 10 MG/ML IV BOLUS
INTRAVENOUS | Status: DC | PRN
Start: 2024-05-03 — End: 2024-05-03
  Administered 2024-05-03: 70 mg via INTRAVENOUS

## 2024-05-03 MED ORDER — SODIUM CHLORIDE 0.9 % IR SOLN
Status: DC | PRN
  Administered 2024-05-03: 3000 mL

## 2024-05-03 MED ORDER — LIDOCAINE 2% (20 MG/ML) 5 ML SYRINGE
INTRAMUSCULAR | Status: DC | PRN
  Administered 2024-05-03: 80 mg via INTRAVENOUS

## 2024-05-03 MED ORDER — HYDRALAZINE HCL 20 MG/ML IJ SOLN
INTRAMUSCULAR | Status: AC
  Filled 2024-05-03: qty 1

## 2024-05-03 SURGICAL SUPPLY — 16 items
CATH ROBINSON RED A/P 16FR (CATHETERS) ×3 IMPLANT
COVER MAYO STAND STRL (DRAPES) ×3 IMPLANT
CURETTE PIPELLE ENDOMTRL SUCTN (MISCELLANEOUS) IMPLANT
DEVICE MYOSURE LITE (MISCELLANEOUS) IMPLANT
DEVICE MYOSURE REACH (MISCELLANEOUS) IMPLANT
GLOVE SURG ORTHO 8.0 STRL STRW (GLOVE) ×3 IMPLANT
GLOVE SURG UNDER POLY LF SZ7 (GLOVE) ×6 IMPLANT
GOWN STRL REUS W/ TWL XL LVL3 (GOWN DISPOSABLE) ×6 IMPLANT
KIT PROCED FLUENT PRO FLT212S (KITS) ×3 IMPLANT
KIT TURNOVER KIT B (KITS) ×3 IMPLANT
PACK VAGINAL MINOR WOMEN LF (CUSTOM PROCEDURE TRAY) ×3 IMPLANT
PAD OB MATERNITY 11 LF (PERSONAL CARE ITEMS) ×3 IMPLANT
SEAL ROD LENS SCOPE MYOSURE (ABLATOR) ×3 IMPLANT
SLEEVE SCD COMPRESS KNEE MED (STOCKING) ×3 IMPLANT
SYSTEM TISS REMOVAL MYOSURE XL (MISCELLANEOUS) IMPLANT
TOWEL GREEN STERILE FF (TOWEL DISPOSABLE) ×6 IMPLANT

## 2024-05-03 NOTE — Op Note (Signed)
 PREOPERATIVE DIAGNOSIS:  Post menopausal uterine bleeding POSTOPERATIVE DIAGNOSIS: The same PROCEDURE:Exam under anesthesia, Dilation and Curettage with ultrasound guidance. SURGEON:  Dr. Jerilynn Buddle   INDICATIONS: 89 y.o. No obstetric history on file.  here for scheduled surgery for the aforementioned diagnoses.   Risks of surgery were discussed with the patient including but not limited to: bleeding which may require transfusion; infection which may require antibiotics; injury to uterus or surrounding organs; intrauterine scarring which may impair future fertility; need for additional procedures including laparotomy or laparoscopy; and other postoperative/anesthesia complications. Written informed consent was obtained.    FINDINGS:  A 9 week size uterus.  Scant return with endometrial curettings  ANESTHESIA:   General LMA INTRAVENOUS FLUIDS:  300 ml of LR ESTIMATED BLOOD LOSS:  Less than 10 ml UOP: 300 ml SPECIMENS: Endometrial curettings sent to pathology COMPLICATIONS:  None immediate.  PROCEDURE DETAILS:  The patient was then taken to the operating room where general anesthesia was administered and was found to be adequate.  After an adequate timeout was performed, she was placed in the dorsal lithotomy position with some difficulty due to her arthritis and contractures; then prepped and draped in the sterile manner.   A speculum was then placed in the patient's vagina and a ring was applied to the anterior lip of the cervix which was then placed on traction. With ultrasound guidance the cervix and uterus was gently dilated to accommodate endometrial curetting.  Decision was made to forgo hysteroscopy In lieu of getting a good tissue sample.  Cervix was dilated to 18 french and both endometrial curetting and endometrial biopsy samples were taken.  A sharp curettage was then performed to obtain a scant amount of endometrial curettings. The uterus was sounded to 9 cm during this process. The  tenaculum was removed from the anterior lip of the cervix and the vaginal speculum was removed after noting good hemostasis.  The patient tolerated the procedure well and was taken to the recovery area awake, extubated and in stable condition.   The patient will return to her room per PACU criteria.  Care to be assumed by medicine team  Routine postoperative instructions given.   She will have a virtual visit in 2-3 weeks  for postoperative evaluation and discussion.  Gyn team will sign off unless there are any unforseen surgical complications.   Jerilynn Buddle, MD, FACOG Obstetrician & Gynecologist, [facility], [facility]

## 2024-05-03 NOTE — Progress Notes (Signed)
 PROGRESS NOTE    ALAJA Osborn  FMW:[id number] DOB: 1933/11/27 DOA: 04/25/2024 PCP: Sim Emery CROME, MD   Brief Narrative:  This 89 yrs-old Female with PMH significant for rectovaginal fistula, HTN, HLD, osteoarthritis and anemia presented to ED with bleeding. She was noted to have blood trickling down her legs on Friday.  Bleeding subsided but she noted blood when she wiped after urination on Saturday, and she came to ED.  Not on anticoagulation, antiplatelets or NSAID.  Denies history of hemorrhoids or diverticulosis. Denies any trauma or  fall. She was hypertensive  140/112 in ED.  Slightly tachycardic to 110.  Hgb 12.1 (10.6 in 2022).  Troponin elevated to 269 and 366.  EKG sinus rhythm with prolonged PR but no acute ischemic finding.  CT A/P showed 2.3 cm endometrial wall thickening, fibroid uterus and moderate stool in colon.  Cardiology consulted about elevated troponin and BP recommended trending troponin, TTE and consulting again if abnormal finding on TTE.  Hgb stable at about 9.6 after initial drop.  BRBPR noted during peri care by RN the morning of 10/21.  GI consulted.   Blood pressure improved.  TTE without significant finding. GYN consulted.  Patient is now scheduled for hysteroscopy with D&C today.  Needs cardiology clearance.  Assessment & Plan:   Principal Problem:   Hypertensive urgency Active Problems:   Elevated troponin   Rectovaginal fistula with involvement of anal sphincter   Gross hematuria   Increased endometrial stripe thickness   Hypertensive urgency:  > Improved. SBP was as high as 240 in ED. Blood pressure has improved now.   Continue clonidine  0.2 mg 3 times daily. Continue losartan 100 mg daily. Added holding parameters.   Hematochezia /rectal bleed:  Blood noted during exam by EDP but source was not clear.   CT A/P with endometrial wall thickening and fibroid uterus.   Hemoglobin dropped from 12.1>9.7 but her Hgb was 10.6 about 3 years ago.    UA with small Hgb and 6-10 RBC on hpf.   Not on AC, antiplatelets or NSAIDs.   RN noted BRBPR on 10/21.  Anemia panel with some iron deficiency. GI consulted , recommended GYN evaluation given history of rectovaginal fistula. Gyn, Dr. Ozan evaluated the patient, states the bleeding is from endometrium likely postmenopausal bleeding. Bleeding has already stopped.  Recommended outpatient follow-up. Continue MiraLAX for possible constipation.  Elevated troponin / type II MI:  Demand ischemia Likely due to uncontrolled BP.  EKG without acute ischemic finding.   Troponin trended from 269 and peaked at 393.  TTE without significant finding.  Patient without cardiopulmonary symptoms. Normalize BP as above. Cardiology consulted for preop clearance.   Microcytic iron deficiency anemia:  Hgb 12.1 on admission.  Unknown b/l but 10.6 about 3 years ago Management as above. Start PO iron on discharge.   Endometrial wall thickening/uterine fibroid: Gyn, Dr. Ozan evaluated the patient and advised outpatient follow-up. Due to severe arthritis patient unable to bend knees.  Needs outpatient follow up. Patient needs outpatient endometrial biopsy. Patient is now scheduled for hysteroscopy with D&C today.   Patient is cleared with mild to moderate risk   Osteoarthritis/generalized weakness:  Significant pain in both hands. -Tylenol  as needed -Added Voltaren  gel for hands. -PT/OT eval > SNF pending insurance auth.   History of rectovaginal fistula:  Reportedly diagnosed in 2007 during colonoscopy CTA/P does not show any evidence of fistula.   Hypokalemia: Replaced.  Continue to monitor.   Generalized weakness/physical deconditioning :  Patient lives alone. - Therapy recommended SNF.  Patient in agreement.    DVT prophylaxis: Lovenox Code Status: DNR  Family Communication: No family at bed side. Disposition Plan:    Status is: Inpatient Remains inpatient appropriate because: Patient is  medically ready for discharge.  Patient is now scheduled for hysteroscopy with D&C today.  Consultants:  GI GYN  Procedures:CT A/P  Antimicrobials:  Anti-infectives (From admission, onward)    None      Subjective: Patient was seen and examined at bedside. Overnight events noted. Patient reports bleeding has stopped.  She denies any rectal bleeding. H&H slightly down but stable.  She is awaiting SNF placement,  insurance authorization pending. Patient is scheduled to have hysteroscopy with D&C today  Objective: Vitals:   05/03/24 0519 05/03/24 0827 05/03/24 1024 05/03/24 1026  BP: (!) 181/66 (!) 147/60  (!) 173/77  Pulse: 77 63 70   Resp:      Temp:  97.9 F (36.6 C) 98 F (36.7 C)   TempSrc:  Oral Oral   SpO2:  99% 98%     Intake/Output Summary (Last 24 hours) at 05/03/2024 1053 Last data filed at 05/02/2024 2130 Gross per 24 hour  Intake 50 ml  Output --  Net 50 ml    There were no vitals filed for this visit.  Examination:  General exam: Appears calm and comfortable, not in any acute distress. Respiratory system: CTA Bilaterally . Respiratory effort normal.  RR 14 Cardiovascular system: S1 & S2 heard, RRR. No JVD, murmurs, rubs, gallops or clicks.  Gastrointestinal system: Abdomen is non distended, soft and non tender.  Normal bowel sounds heard. Central nervous system: Alert and oriented x 3. No focal neurological deficits. Extremities: No edema, no cyanosis, no clubbing. Skin: No rashes, lesions or ulcers Psychiatry: Judgement and insight appear normal. Mood & affect appropriate.   Data Reviewed: I have personally reviewed following labs and imaging studies  CBC: Recent Labs  Lab 04/27/24 0449 04/28/24 0438 04/30/24 0217 05/01/24 0156 05/02/24 0151 05/02/24 1852 05/03/24 0508  WBC 6.2 5.4 5.1  --   --  6.4 4.7  HGB 9.7* 9.6* 9.2* 8.7* 8.6* 10.0* 9.6*  HCT 30.2* 30.4* 29.9* 28.1* 27.8* 32.5* 30.9*  MCV 71.4* 71.9* 72.7*  --   --  73.9* 72.7*   PLT 223 238 214  --   --  281 266   Basic Metabolic Panel: Recent Labs  Lab 04/27/24 0449 04/28/24 0438 04/30/24 0217 05/02/24 1852 05/03/24 0508  NA 139 139 138 136 137  K 4.3 4.0 4.3 4.6 4.5  CL 108 109 108 103 105  CO2 22 22 21* 21* 22  GLUCOSE 103* 100* 99 122* 102*  BUN 22 27* 29* 40* 35*  CREATININE 1.08* 1.13* 1.09* 1.17* 0.99  CALCIUM 9.6 9.7 9.4 9.9 10.1  MG 2.1 2.3 2.2  --  2.4  PHOS 3.2 3.9 4.1  --  3.8   GFR: CrCl cannot be calculated (Unknown ideal weight.). Liver Function Tests: Recent Labs  Lab 04/27/24 0449 04/28/24 0438 05/03/24 0508  AST  --   --  20  ALT  --   --  12  ALKPHOS  --   --  85  BILITOT  --   --  0.6  PROT  --   --  6.8  ALBUMIN 3.3* 3.2* 3.5   No results for input(s): LIPASE, AMYLASE in the last 168 hours. No results for input(s): AMMONIA in the last 168 hours. Coagulation Profile:  No results for input(s): INR, PROTIME in the last 168 hours. Cardiac Enzymes: No results for input(s): CKTOTAL, CKMB, CKMBINDEX, TROPONINI in the last 168 hours. BNP (last 3 results) No results for input(s): PROBNP in the last 8760 hours. HbA1C: No results for input(s): HGBA1C in the last 72 hours. CBG: No results for input(s): GLUCAP in the last 168 hours. Lipid Profile: No results for input(s): CHOL, HDL, LDLCALC, TRIG, CHOLHDL, LDLDIRECT in the last 72 hours. Thyroid Function Tests: No results for input(s): TSH, T4TOTAL, FREET4, T3FREE, THYROIDAB in the last 72 hours. Anemia Panel: No results for input(s): VITAMINB12, FOLATE, FERRITIN, TIBC, IRON, RETICCTPCT in the last 72 hours.  Sepsis Labs: No results for input(s): PROCALCITON, LATICACIDVEN in the last 168 hours.  Recent Results (from the past 240 hours)  Surgical pcr screen     Status: None   Collection Time: 05/03/24  6:05 AM   Specimen: Nasal Mucosa; Nasal Swab  Result Value Ref Range Status   MRSA, PCR NEGATIVE NEGATIVE  Final   Staphylococcus aureus NEGATIVE NEGATIVE Final    Comment: (NOTE) The Xpert SA Assay (FDA approved for NASAL specimens in patients 19 years of age and older), is one component of a comprehensive surveillance program. It is not intended to diagnose infection nor to guide or monitor treatment. Performed at [facility], [address], [place], [place] [address]      Radiology Studies: No results found.  Scheduled Meds:  [MAR Hold] cloNIDine   0.2 mg Oral Q8H   [MAR Hold] diclofenac  Sodium  2 g Topical QID   [MAR Hold] Ferrous Fumarate  318 mg of iron Oral Q breakfast   [MAR Hold] losartan  100 mg Oral Daily   [MAR Hold] polyethylene glycol  17 g Oral Daily   Continuous Infusions:  sodium chloride  Stopped (05/03/24 1032)   lactated ringers       LOS: 8 days    Time spent: 35 mins    Darcel Dawley, MD [facility]   If 7PM-7AM, please contact night-coverage

## 2024-05-03 NOTE — Transfer of Care (Signed)
 Immediate Anesthesia Transfer of Care Note  Patient: [name]  Procedure(s) Performed: DILATION AND CURETTAGE EXAM UNDER ANESTHESIA, PELVIC  Patient Location: PACU  Anesthesia Type:General  Level of Consciousness: drowsy and patient cooperative  Airway & Oxygen Therapy: Patient Spontanous Breathing and Patient connected to face mask oxygen  Post-op Assessment: Report given to RN, Post -op Vital signs reviewed and stable, Patient moving all extremities, and Patient moving all extremities X 4  Post vital signs: Reviewed and stable  Last Vitals:  Vitals Value Taken Time  BP 187/85 05/03/24 12:02  Temp 36.7 C 05/03/24 12:02  Pulse 68 05/03/24 12:05  Resp 17 05/03/24 12:05  SpO2 100 % 05/03/24 12:05  Vitals shown include unfiled device data.  Last Pain:  Vitals:   05/03/24 1026  TempSrc:   PainSc: 10-Worst pain ever      Patients Stated Pain Goal: 0 (04/28/24 2251)  Complications: No notable events documented.

## 2024-05-03 NOTE — Progress Notes (Signed)
 Pt's BP 173/77, 166/81. Dr. Darlyn is aware.

## 2024-05-03 NOTE — Anesthesia Procedure Notes (Signed)
 Procedure Name: LMA Insertion Date/Time: 05/03/2024 11:19 AM  Performed by: Arvell Edsel HERO, CRNAPre-anesthesia Checklist: Patient identified, Emergency Drugs available, Suction available, Patient being monitored and Timeout performed Patient Re-evaluated:Patient Re-evaluated prior to induction Oxygen Delivery Method: Circle system utilized Preoxygenation: Pre-oxygenation with 100% oxygen Induction Type: IV induction LMA: LMA inserted LMA Size: 3.0 Tube secured with: Tape

## 2024-05-03 NOTE — Anesthesia Postprocedure Evaluation (Signed)
 Anesthesia Post Note  Patient: [name]  Procedure(s) Performed: DILATION AND CURETTAGE EXAM UNDER ANESTHESIA, PELVIC     Patient location during evaluation: PACU Anesthesia Type: General Level of consciousness: sedated and patient cooperative Pain management: pain level controlled Vital Signs Assessment: post-procedure vital signs reviewed and stable Respiratory status: spontaneous breathing Cardiovascular status: stable Anesthetic complications: no   No notable events documented.  Last Vitals:  Vitals:   05/03/24 1622 05/03/24 2005  BP: (!) 145/62 (!) 144/91  Pulse: 70 75  Resp:  16  Temp: 36.6 C (!) 36.3 C  SpO2: 100% 100%    Last Pain:  Vitals:   05/03/24 2005  TempSrc: Oral  PainSc:                  [name] Pope

## 2024-05-03 NOTE — Plan of Care (Signed)

## 2024-05-03 NOTE — Anesthesia Preprocedure Evaluation (Addendum)
 Anesthesia Evaluation  Patient identified by MRN, date of birth, ID band Patient awake    Reviewed: Allergy & Precautions, NPO status , Patient's Chart, lab work & pertinent test results  Airway Mallampati: II  TM Distance: >3 FB Neck ROM: Limited    Dental  (+) Poor Dentition, Missing, Loose, Dental Advisory Given,    Pulmonary neg pulmonary ROS   Pulmonary exam normal breath sounds clear to auscultation       Cardiovascular hypertension, Pt. on medications Normal cardiovascular exam Rhythm:Regular Rate:Normal  Echo 04/2024 1. Left ventricular ejection fraction, by estimation, is 60 to 65%. The left  ventricle has normal function. The left ventricle has no regional wall  motion abnormalities. There is moderate concentric left ventricular  hypertrophy. Left ventricular diastolic parameters are consistent with  Grade I diastolic dysfunction (impaired relaxation).   2. Right ventricular systolic function is normal. The right ventricular size  is normal.   3. The mitral valve is normal in structure. Trivial mitral valve  regurgitation. No evidence of mitral stenosis.   4. The aortic valve is tricuspid. Aortic valve regurgitation is not visualized.  Aortic valve sclerosis is present, with no evidence of aortic valve stenosis.   5. The inferior vena cava is normal in size with greater than 50%  respiratory variability, suggesting right atrial pressure of 3 mmHg.   Comparison(s): No prior Echocardiogram.     Neuro/Psych    GI/Hepatic negative GI ROS, Neg liver ROS,,,  Endo/Other  diabetes    Renal/GU negative Renal ROS     Musculoskeletal  (+) Arthritis ,    Abdominal   Peds  Hematology  (+) Blood dyscrasia, anemia   Anesthesia Other Findings   Reproductive/Obstetrics                              Anesthesia Physical Anesthesia Plan  ASA: 3  Anesthesia Plan: General   Post-op Pain  Management: Minimal or no pain anticipated   Induction: Intravenous  PONV Risk Score and Plan: 4 or greater and Ondansetron, Dexamethasone and Treatment may vary due to age or medical condition  Airway Management Planned: LMA  Additional Equipment:   Intra-op Plan:   Post-operative Plan: Extubation in OR  Informed Consent: I have reviewed the patients History and Physical, chart, labs and discussed the procedure including the risks, benefits and alternatives for the proposed anesthesia with the patient or authorized representative who has indicated his/her understanding and acceptance.   Patient has DNR.  Discussed DNR with patient and Suspend DNR.   Dental advisory given  Plan Discussed with: CRNA  Anesthesia Plan Comments: (Risks of anesthesia explained at length. This includes, but is not limited to, sore throat, damage to teeth, lips gums, tongue and vocal cords, nausea and vomiting, reactions to medications, stroke, heart attack, and death. All patient questions were answered and the patient wishes to proceed. )        Anesthesia Quick Evaluation

## 2024-05-03 NOTE — Interval H&P Note (Signed)
 History and Physical Interval Note:  05/03/2024 11:04 AM  [name]  has presented today for surgery, with the diagnosis of postmenopausal bleeding.  The various methods of treatment have been discussed with the patient and family. After consideration of risks, benefits and other options for treatment, the patient has consented to  Procedure(s): DILATATION AND CURETTAGE /HYSTEROSCOPY (N/A) as a surgical intervention.  The patient's history has been reviewed, patient examined, no change in status, stable for surgery.  I have reviewed the patient's chart and labs.  Questions were answered to the patient's satisfaction.     Jerilynn DELENA Buddle

## 2024-05-04 ENCOUNTER — Encounter (HOSPITAL_COMMUNITY): Payer: Self-pay | Admitting: Obstetrics and Gynecology

## 2024-05-04 DIAGNOSIS — I160 Hypertensive urgency: Secondary | ICD-10-CM | POA: Diagnosis not present

## 2024-05-04 LAB — CBC
HCT: 30.5 % — ABNORMAL LOW (ref 36.0–46.0)
Hemoglobin: 9.5 g/dL — ABNORMAL LOW (ref 12.0–15.0)
MCH: 22.7 pg — ABNORMAL LOW (ref 26.0–34.0)
MCHC: 31.1 g/dL (ref 30.0–36.0)
MCV: 73 fL — ABNORMAL LOW (ref 80.0–100.0)
Platelets: 250 K/uL (ref 150–400)
RBC: 4.18 MIL/uL (ref 3.87–5.11)
RDW: 15.1 % (ref 11.5–15.5)
WBC: 7 K/uL (ref 4.0–10.5)
nRBC: 0 % (ref 0.0–0.2)

## 2024-05-04 LAB — BASIC METABOLIC PANEL WITH GFR
Anion gap: 11 (ref 5–15)
BUN: 35 mg/dL — ABNORMAL HIGH (ref 8–23)
CO2: 21 mmol/L — ABNORMAL LOW (ref 22–32)
Calcium: 9.7 mg/dL (ref 8.9–10.3)
Chloride: 106 mmol/L (ref 98–111)
Creatinine, Ser: 1.15 mg/dL — ABNORMAL HIGH (ref 0.44–1.00)
GFR, Estimated: 46 mL/min — ABNORMAL LOW (ref >60)
Glucose, Bld: 110 mg/dL — ABNORMAL HIGH (ref 70–99)
Potassium: 4.6 mmol/L (ref 3.5–5.1)
Sodium: 138 mmol/L (ref 135–145)

## 2024-05-04 LAB — PHOSPHORUS: Phosphorus: 3.3 mg/dL (ref 2.5–4.6)

## 2024-05-04 LAB — MAGNESIUM: Magnesium: 2.2 mg/dL (ref 1.7–2.4)

## 2024-05-04 MED ORDER — CLONIDINE HCL 0.2 MG PO TABS: 0.2000 mg | ORAL_TABLET | Freq: Three times a day (TID) | ORAL | 1 refills | Status: AC

## 2024-05-04 MED ORDER — HYDROCODONE-ACETAMINOPHEN 5-325 MG PO TABS: 1.0000 | ORAL_TABLET | Freq: Two times a day (BID) | ORAL | 0 refills | Status: AC | PRN

## 2024-05-04 NOTE — Progress Notes (Signed)
 AVS DNR and script in packet

## 2024-05-04 NOTE — Plan of Care (Signed)

## 2024-05-04 NOTE — Clinical Note (Incomplete)
 Pt to be discharge to skilled nursing facility. Discharge instructions reviewed and patient/family verbalized understanding. Report called to [facility]. Prescriptions have been called into pharmacy (indicate if [facility] pharmacy used and those have been picked up) Pt escorted to exit by [facility] stretcher.

## 2024-05-04 NOTE — TOC Transition Note (Signed)
 Transition of Care [facility] - Discharge Note   Patient Details  Name: [name] MRN: [id number] Date of Birth: 18-Oct-1933  Transition [facility] CM/SW Contact:  Sherline Clack, LCSWA Phone Number: 05/04/2024, 1:52 PM   Clinical Narrative:     Patient will DC to: [facility] Anticipated DC date: 05/04/24  Family notified: James/brother Transport by: [facility]   Per MD patient ready for DC to [facility]. RN to call report prior to discharge [phone], room [address] east hall). RN, patient, patient's family, and facility notified of DC. Discharge Summary and FL2 sent to facility. DC packet on chart. Ambulance transport requested for patient.   CSW will sign off for now as social work intervention is no longer needed. Please consult us  again if new needs arise.    Final next level of care: Skilled Nursing Facility Barriers to Discharge: Barriers Resolved   Patient Goals and CMS Choice            Discharge Placement              Patient chooses bed at: [facility] Patient to be transferred to facility by: [facility] Name of family member notified: James/brother Patient and family notified of of transfer: 05/04/24  Discharge Plan and Services Additional resources added to the After Visit Summary for                                       Social Drivers of Health (SDOH) Interventions SDOH Screenings   Food Insecurity: No Food Insecurity (04/26/2024)  Housing: Low Risk  (04/26/2024)  Transportation Needs: No Transportation Needs (04/26/2024)  Utilities: Not At Risk (04/26/2024)  Social Connections: Moderately Integrated (04/26/2024)  Tobacco Use: Low Risk  (05/03/2024)     Readmission Risk Interventions    04/27/2024    2:13 PM  Readmission Risk Prevention Plan  Post Dischage Appt Complete  Medication Screening Complete  Transportation Screening Complete

## 2024-05-04 NOTE — Discharge Instructions (Signed)
 Advised to follow up PCP in one week Advised to follow up Gyn/OB in one week to discuss endometrial biopsy results. Patient is being discharged to SNF for rehab.

## 2024-05-04 NOTE — Discharge Summary (Signed)
 Physician Discharge Summary  [name] FMW:[id number] DOB: January 04, 1934 DOA: 04/25/2024  PCP: Sim Emery CROME, MD  Admit date: 04/25/2024  Discharge date: 05/04/2024  Admitted From: Home  Disposition:  SNF [facility])  Recommendations for Outpatient Follow-up:  Follow up with PCP in 1-2 weeks. Please obtain BMP/CBC in one week. Advised to follow up Gyn/OB in one week to discuss endometrial biopsy results. Patient is being discharged to SNF for rehab.  Home Health: None Equipment/Devices:None  Discharge Condition: Stable CODE STATUS: DNR Diet recommendation: Heart Healthy   Brief Summary/ Hospital Course: This 89 yrs-old Female with PMH significant for HTN, HLD, osteoarthritis and anemia presented to ED with bleeding. She was noted to have blood trickling down her legs on Friday.  Bleeding subsided but she noted blood when she wiped after urination on Saturday, and she came to ED.  Not on anticoagulation, antiplatelets or NSAID.  Denies history of hemorrhoids or diverticulosis. Denies any trauma or  fall. She was hypertensive  140/112 in ED.  Slightly tachycardic to 110.  Hgb 12.1 (10.6 in 2022).  Troponin elevated to 269 and 366.  EKG sinus rhythm with prolonged PR but no acute ischemic finding.  CT A/P showed 2.3 cm endometrial wall thickening, fibroid uterus and moderate stool in colon.  Cardiology consulted about elevated troponin and elevated BP recommended trending troponin, TTE and consulting again if abnormal finding on TTE.  Patient was admitted for further evaluation. Hgb stable at about 9.6 after initial drop.  BRBPR noted during peri care by RN the morning of 10/21.  GI consulted.   Blood pressure improved.  TTE without significant finding.  GI recommended GYN consultation for possible endometrial bleeding. GYN consulted.  Patient underwent hysteroscopy with D&C after having preop clearance by cardiology.  Patient feels much better , bleeding has improved.  She  wants to be discharged.  GYN will follow-up endometrial biopsy outpatient.  She is being discharged to SNF for rehab.  Discharge Diagnoses:  Principal Problem:   Hypertensive urgency Active Problems:   Elevated troponin   Rectovaginal fistula with involvement of anal sphincter   Gross hematuria   Increased endometrial stripe thickness   Postmenopausal bleeding  Hypertensive urgency:  > Improved. SBP was as high as 240 in ED. Blood pressure has improved now.   Continue clonidine  0.2 mg 3 times daily. Continue losartan 100 mg daily.   Hematochezia /rectal bleed:  Blood noted during exam by EDP but source was not clear.   CT A/P with endometrial wall thickening and fibroid uterus.   Hemoglobin dropped from 12.1>9.7 but her Hgb was 10.6 about 3 years ago.   UA with small Hgb and 6-10 RBC on hpf.   Not on AC, antiplatelets or NSAIDs.   RN noted BRBPR on 10/21.  Anemia panel with some iron deficiency. GI consulted , recommended GYN evaluation given history of rectovaginal fistula. Gyn, Dr. Ozan evaluated the patient, states the bleeding is from endometrium likely postmenopausal bleeding. Bleeding has already stopped.   Continue MiraLAX for possible constipation.   Elevated troponin / type II MI:  Demand ischemia Likely due to uncontrolled BP.  EKG without acute ischemic finding.   Troponin trended from 269 and peaked at 393.  TTE without significant finding.  Patient without cardiopulmonary symptoms. Cardiology consulted for preop clearance.   Microcytic iron deficiency anemia:  Hgb 12.1 on admission.  Unknown b/l but 10.6 about 3 years ago Management as above. Start PO iron on discharge.   Endometrial  wall thickening/uterine fibroid: Gyn, Dr. Ozan evaluated the patient and advised outpatient follow-up. Due to severe arthritis patient unable to bend knees.  Needs outpatient follow up. Patient needs outpatient endometrial biopsy. Patient is cleared with mild to moderate  risk. Patient underwent hysteroscopy with D&C.  Outpatient follow-up for endometrial biopsy results.   Osteoarthritis/generalized weakness:  Significant pain in both hands. -Tylenol  as needed, -Added Voltaren  gel for hands. -PT/OT eval > SNF pending insurance auth.   History of rectovaginal fistula:  Reportedly diagnosed in 2007 during colonoscopy CTA/P does not show any evidence of fistula.   Hypokalemia: Replaced.  Continue to monitor.   Generalized weakness/physical deconditioning :  Patient lives alone. - Therapy recommended SNF.  Patient in agreement.  Discharge Instructions  Discharge Instructions     Call MD for:  difficulty breathing, headache or visual disturbances   Complete by: As directed    Call MD for:  persistant dizziness or light-headedness   Complete by: As directed    Call MD for:  persistant nausea and vomiting   Complete by: As directed    Diet - low sodium heart healthy   Complete by: As directed    Diet general   Complete by: As directed    Discharge instructions   Complete by: As directed    Advised to follow up PCP in one week Advised to follow up Gyn/OB in one week to discuss endometrial biopsy results. Patient is being discharged to SNF for rehab.   Increase activity slowly   Complete by: As directed    No wound care   Complete by: As directed       Allergies as of 05/04/2024       Reactions   Penicillins Other (See Comments)   Skin turned purple   Sulfonamide Derivatives Other (See Comments)   swell        Medication List     TAKE these medications    acetaminophen  650 MG CR tablet Commonly known as: TYLENOL  Take 650 mg by mouth every 8 (eight) hours as needed for pain.   cloNIDine  0.2 MG tablet Commonly known as: CATAPRES  Take 1 tablet (0.2 mg total) by mouth every 8 (eight) hours. What changed:  medication strength how much to take when to take this   diclofenac  Sodium 1 % Gel Commonly known as: Voltaren  Apply 2  g topically 4 (four) times daily.   Ferretts 325 (106 Fe) MG Tabs tablet Generic drug: ferrous fumarate Take 1 tablet by mouth daily.   HYDROcodone-acetaminophen  5-325 MG tablet Commonly known as: NORCO/VICODIN Take 1 tablet by mouth 2 (two) times daily as needed for up to 3 days.   hydrOXYzine 10 MG tablet Commonly known as: ATARAX Take 10 mg by mouth every 6 (six) hours as needed.   losartan 100 MG tablet Commonly known as: COZAAR Take 100 mg by mouth daily.   PreserVision AREDS 2 Caps Take 2 capsules by mouth in the morning.        Contact information for follow-up providers     [facility]. Schedule an appointment as soon as possible for a visit in 2 week(s).   Specialty: Obstetrics and Gynecology Why: please schedule a virtual postop appointment in 2-3 weeks Contact information: [address] [place]  [address] [phone]        Sim Emery CROME, MD Follow up in 1 week(s).   Specialty: Internal Medicine Contact information: [phone] [place]  [address]              Contact information for after-discharge care     Destination     [facility] .   Service: Skilled Nursing Contact information: [address] [place]  [address] [phone]                    Allergies  Allergen Reactions   Penicillins Other (See Comments)    Skin turned purple   Sulfonamide Derivatives Other (See Comments)    swell    Consultations: Gynecology Gastroenterology   Procedures/Studies: US  Intraoperative Result Date: 05/03/2024 CLINICAL DATA:  Ultrasound was provided for use by the ordering physician.  No provider Interpretation or professional fees incurred.    ECHOCARDIOGRAM COMPLETE Result Date: 04/27/2024    ECHOCARDIOGRAM REPORT   Patient Name:   [name] Date of Exam: 04/27/2024 Medical Rec #:  [id number]             Height:        62.0 in Accession #:    [id number]            Weight:       170.0 lb Date of Birth:  Dec 03, 1933             BSA:          1.784 m Patient Age:    89 years              BP:           154/69 mmHg Patient Gender: F                     HR:           78 bpm. Exam Location:  Inpatient Procedure: 2D Echo, Cardiac Doppler and Color Doppler (Both Spectral and Color            Flow Doppler were utilized during procedure). Indications:    Elevated Troponin  History:        Patient has no prior history of Echocardiogram examinations.                 Risk Factors:Dyslipidemia and Hypertension.  Sonographer:    Philomena Daring Referring Phys: CLARETTA HERO AMPONSAH IMPRESSIONS  1. Left ventricular ejection fraction, by estimation, is 60 to 65%. The left ventricle has normal function. The left ventricle has no regional wall motion abnormalities. There is moderate concentric left ventricular hypertrophy. Left ventricular diastolic parameters are consistent with Grade I diastolic dysfunction (impaired relaxation).  2. Right ventricular systolic function is normal. The right ventricular size is normal.  3. The mitral valve is normal in structure. Trivial mitral valve regurgitation. No evidence of mitral stenosis.  4. The aortic valve is tricuspid. Aortic valve regurgitation is not visualized. Aortic valve sclerosis is present, with no evidence of aortic valve stenosis.  5. The inferior vena cava is normal in size with greater than 50% respiratory variability, suggesting right atrial pressure of 3 mmHg. Comparison(s): No prior Echocardiogram. FINDINGS  Left Ventricle: Left ventricular ejection fraction, by estimation, is 60 to 65%. The left ventricle has normal function. The left ventricle has no regional wall motion abnormalities. The left ventricular internal cavity size was normal in size. There is  moderate concentric left ventricular hypertrophy. Left ventricular diastolic parameters are consistent with Grade I diastolic dysfunction  (impaired relaxation). Right Ventricle: The right ventricular size is normal. Right vetricular wall thickness was not well  visualized. Right ventricular systolic function is normal. Left Atrium: Left atrial size was normal in size. Right Atrium: Right atrial size was normal in size. Pericardium: Trivial pericardial effusion is present. Mitral Valve: The mitral valve is normal in structure. Trivial mitral valve regurgitation. No evidence of mitral valve stenosis. Tricuspid Valve: The tricuspid valve is normal in structure. Tricuspid valve regurgitation is trivial. No evidence of tricuspid stenosis. Aortic Valve: The aortic valve is tricuspid. Aortic valve regurgitation is not visualized. Aortic valve sclerosis is present, with no evidence of aortic valve stenosis. Pulmonic Valve: The pulmonic valve was not well visualized. Pulmonic valve regurgitation is not visualized. No evidence of pulmonic stenosis. Aorta: The aortic root and ascending aorta are structurally normal, with no evidence of dilitation. Venous: The inferior vena cava is normal in size with greater than 50% respiratory variability, suggesting right atrial pressure of 3 mmHg. IAS/Shunts: No atrial level shunt detected by color flow Doppler.  LEFT VENTRICLE PLAX 2D LVIDd:         2.90 cm   Diastology LVIDs:         1.90 cm   LV e' medial:    5.33 cm/s LV PW:         1.20 cm   LV E/e' medial:  14.4 LV IVS:        1.60 cm   LV e' lateral:   6.42 cm/s LVOT diam:     2.00 cm   LV E/e' lateral: 12.0 LV SV:         80 LV SV Index:   45 LVOT Area:     3.14 cm  RIGHT VENTRICLE             IVC RV Basal diam:  3.00 cm     IVC diam: 1.30 cm RV Mid diam:    2.50 cm RV S prime:     13.90 cm/s TAPSE (M-mode): 2.9 cm LEFT ATRIUM             Index        RIGHT ATRIUM           Index LA diam:        2.70 cm 1.51 cm/m   RA Area:     15.20 cm LA Vol (A2C):   28.3 ml 15.86 ml/m  RA Volume:   34.00 ml  19.06 ml/m LA Vol (A4C):   28.8 ml 16.14 ml/m LA Biplane Vol: 30.0  ml 16.82 ml/m  AORTIC VALVE LVOT Vmax:   141.00 cm/s LVOT Vmean:  95.600 cm/s LVOT VTI:    0.254 m  AORTA Ao Root diam: 2.80 cm Ao Asc diam:  2.80 cm MITRAL VALVE MV Area (PHT): 4.49 cm     SHUNTS MV Decel Time: 169 msec     Systemic VTI:  0.25 m MV E velocity: 77.00 cm/s   Systemic Diam: 2.00 cm MV A velocity: 102.00 cm/s MV E/A ratio:  0.75 Emeline Calender Electronically signed by Emeline Calender Signature Date/Time: 04/27/2024/2:27:04 PM    Final    CT ABDOMEN PELVIS W CONTRAST Result Date: 04/25/2024 CLINICAL DATA:  Suprapubic pain with vaginal versus rectal bleeding EXAM: CT ABDOMEN AND PELVIS WITH CONTRAST TECHNIQUE: Multidetector CT imaging of the abdomen and pelvis was performed using the standard protocol following bolus administration of intravenous contrast. RADIATION DOSE REDUCTION: This exam was performed according to the departmental dose-optimization program which includes automated exposure control, adjustment of the mA and/or kV according to patient size and/or use of  iterative reconstruction technique. CONTRAST:  75mL OMNIPAQUE IOHEXOL 350 MG/ML SOLN COMPARISON:  None Available. FINDINGS: Lower chest: No acute pleural or parenchymal lung disease. Hepatobiliary: Cholecystectomy. Intrahepatic and extrahepatic biliary duct dilation likely related to prior cholecystectomy, with common bile duct measuring up to 13 mm. No focal liver abnormalities. Pancreas: Unremarkable. No pancreatic ductal dilatation or surrounding inflammatory changes. Spleen: Normal in size without focal abnormality. Adrenals/Urinary Tract: Multiple bilateral renal cortical cysts do not require specific imaging follow-up. No urinary tract calculi or obstructive uropathy. Bladder is unremarkable with no wall thickening or filling defect. The adrenals are normal. Stomach/Bowel: No bowel obstruction or ileus. Moderate stool throughout the colon. No bowel wall thickening or inflammatory change. The appendix, if still present, is not well  visualized. Vascular/Lymphatic: Aortic atherosclerosis. No enlarged abdominal or pelvic lymph nodes. Reproductive: Multiple calcified and noncalcified uterine fibroids. Heterogeneous endometrial thickening measuring up to 2.3 cm. No adnexal masses. Other: No free fluid or free intraperitoneal gas. No abdominal wall hernia. Musculoskeletal: Severe bilateral hip osteoarthritis. No acute displaced fractures. Diffuse thoracolumbar spondylosis and facet hypertrophy. Reconstructed images demonstrate no additional findings. IMPRESSION: 1. Abnormal endometrial thickening measuring up to 2.3 cm. Underlying endometrial carcinoma is a concern given history of possible vaginal bleeding. Endometrial sampling is recommended. 2. Moderate stool throughout the colon consistent with constipation. No bowel obstruction or ileus. 3. Cholecystectomy, with postsurgical dilatation of the biliary tree. 4. Fibroid uterus. 5. Severe bilateral hip osteoarthritis. 6.  Aortic Atherosclerosis (ICD10-I70.0). Electronically Signed   By: Ozell Daring M.D.   On: 04/25/2024 21:10   Hysteroscopy with D&C   Subjective: Patient was seen and examined at bedside.  Overnight events noted. Patient reports feeling better,  her bleeding has stopped and she wants to be discharged to SNF.  Discharge Exam: Vitals:   05/04/24 0540 05/04/24 0816  BP: (!) 147/79 (!) 125/52  Pulse: 60 (!) 49  Resp: 18 16  Temp: (!) 97.5 F (36.4 C) 97.6 F (36.4 C)  SpO2: 100% 98%   Vitals:   05/03/24 1622 05/03/24 2005 05/04/24 0540 05/04/24 0816  BP: (!) 145/62 (!) 144/91 (!) 147/79 (!) 125/52  Pulse: 70 75 60 (!) 49  Resp:  16 18 16   Temp: 97.9 F (36.6 C) (!) 97.4 F (36.3 C) (!) 97.5 F (36.4 C) 97.6 F (36.4 C)  TempSrc: Oral Oral Oral Oral  SpO2: 100% 100% 100% 98%    General: Pt is alert, awake, not in acute distress Cardiovascular: RRR, S1/S2 +, no rubs, no gallops Respiratory: CTA bilaterally, no wheezing, no rhonchi Abdominal: Soft,  NT, ND, bowel sounds + Extremities: no edema, no cyanosis  The results of significant diagnostics from this hospitalization (including imaging, microbiology, ancillary and laboratory) are listed below for reference.     Microbiology: Recent Results (from the past 240 hours)  Surgical pcr screen     Status: None   Collection Time: 05/03/24  6:05 AM   Specimen: Nasal Mucosa; Nasal Swab  Result Value Ref Range Status   MRSA, PCR NEGATIVE NEGATIVE Final   Staphylococcus aureus NEGATIVE NEGATIVE Final    Comment: (NOTE) The Xpert SA Assay (FDA approved for NASAL specimens in patients 27 years of age and older), is one component of a comprehensive surveillance program. It is not intended to diagnose infection nor to guide or monitor treatment. Performed at [facility], [address] [place], [place] [address]      Labs: BNP (last 3 results) No results for input(s): BNP in  the last 8760 hours. Basic Metabolic Panel: Recent Labs  Lab 04/28/24 0438 04/30/24 0217 05/02/24 1852 05/03/24 0508 05/04/24 0432  NA 139 138 136 137 138  K 4.0 4.3 4.6 4.5 4.6  CL 109 108 103 105 106  CO2 22 21* 21* 22 21*  GLUCOSE 100* 99 122* 102* 110*  BUN 27* 29* 40* 35* 35*  CREATININE 1.13* 1.09* 1.17* 0.99 1.15*  CALCIUM 9.7 9.4 9.9 10.1 9.7  MG 2.3 2.2  --  2.4 2.2  PHOS 3.9 4.1  --  3.8 3.3   Liver Function Tests: Recent Labs  Lab 04/28/24 0438 05/03/24 0508  AST  --  20  ALT  --  12  ALKPHOS  --  85  BILITOT  --  0.6  PROT  --  6.8  ALBUMIN 3.2* 3.5   No results for input(s): LIPASE, AMYLASE in the last 168 hours. No results for input(s): AMMONIA in the last 168 hours. CBC: Recent Labs  Lab 04/28/24 0438 04/30/24 0217 05/01/24 0156 05/02/24 0151 05/02/24 1852 05/03/24 0508 05/04/24 0432  WBC 5.4 5.1  --   --  6.4 4.7 7.0  HGB 9.6* 9.2* 8.7* 8.6* 10.0* 9.6* 9.5*  HCT 30.4* 29.9* 28.1* 27.8* 32.5* 30.9* 30.5*  MCV 71.9* 72.7*  --   --  73.9* 72.7* 73.0*   PLT 238 214  --   --  281 266 250   Cardiac Enzymes: No results for input(s): CKTOTAL, CKMB, CKMBINDEX, TROPONINI in the last 168 hours. BNP: Invalid input(s): POCBNP CBG: Recent Labs  Lab 05/03/24 2100  GLUCAP 188*   D-Dimer No results for input(s): DDIMER in the last 72 hours. Hgb A1c No results for input(s): HGBA1C in the last 72 hours. Lipid Profile No results for input(s): CHOL, HDL, LDLCALC, TRIG, CHOLHDL, LDLDIRECT in the last 72 hours. Thyroid function studies No results for input(s): TSH, T4TOTAL, T3FREE, THYROIDAB in the last 72 hours.  Invalid input(s): FREET3 Anemia work up No results for input(s): VITAMINB12, FOLATE, FERRITIN, TIBC, IRON, RETICCTPCT in the last 72 hours. Urinalysis    Component Value Date/Time   COLORURINE YELLOW 04/25/2024 1348   APPEARANCEUR CLEAR 04/25/2024 1348   LABSPEC 1.011 04/25/2024 1348   PHURINE 7.0 04/25/2024 1348   GLUCOSEU NEGATIVE 04/25/2024 1348   HGBUR SMALL (A) 04/25/2024 1348   BILIRUBINUR NEGATIVE 04/25/2024 1348   KETONESUR NEGATIVE 04/25/2024 1348   PROTEINUR 100 (A) 04/25/2024 1348   NITRITE NEGATIVE 04/25/2024 1348   LEUKOCYTESUR NEGATIVE 04/25/2024 1348   Sepsis Labs Recent Labs  Lab 04/30/24 0217 05/02/24 1852 05/03/24 0508 05/04/24 0432  WBC 5.1 6.4 4.7 7.0   Microbiology Recent Results (from the past 240 hours)  Surgical pcr screen     Status: None   Collection Time: 05/03/24  6:05 AM   Specimen: Nasal Mucosa; Nasal Swab  Result Value Ref Range Status   MRSA, PCR NEGATIVE NEGATIVE Final   Staphylococcus aureus NEGATIVE NEGATIVE Final    Comment: (NOTE) The Xpert SA Assay (FDA approved for NASAL specimens in patients 31 years of age and older), is one component of a comprehensive surveillance program. It is not intended to diagnose infection nor to guide or monitor treatment. Performed at [facility], [address], [place],  [place] [address]      Time coordinating discharge: Over 30 minutes  SIGNED:   Darcel Dawley, MD  [facility] 05/04/2024, 11:43 AM Pager   If 7PM-7AM, please contact night-coverage

## 2024-05-05 LAB — SURGICAL PATHOLOGY

## 2024-05-06 ENCOUNTER — Ambulatory Visit: Payer: Self-pay | Admitting: Obstetrics and Gynecology

## 2024-05-13 ENCOUNTER — Ambulatory Visit: Payer: Medicare (Managed Care) | Admitting: Podiatry

## 2024-05-20 ENCOUNTER — Telehealth (INDEPENDENT_AMBULATORY_CARE_PROVIDER_SITE_OTHER): Payer: Medicare (Managed Care) | Admitting: Obstetrics and Gynecology

## 2024-05-20 ENCOUNTER — Encounter: Payer: Self-pay | Admitting: Obstetrics and Gynecology

## 2024-05-20 DIAGNOSIS — N950 Postmenopausal bleeding: Secondary | ICD-10-CM

## 2024-05-20 DIAGNOSIS — Z4889 Encounter for other specified surgical aftercare: Secondary | ICD-10-CM

## 2024-05-20 NOTE — Progress Notes (Signed)
 GYNECOLOGY VIRTUAL VISIT ENCOUNTER NOTE  Provider location: [facility]   Patient location: Home  I connected with [name] on 05/20/24 at  2:35 PM EST by MyChart Video Encounter and verified that I am speaking with the correct person using two identifiers.   I discussed the limitations, risks, security and privacy concerns of performing an evaluation and management service virtually and the availability of in person appointments. I also discussed with the patient that there may be a patient responsible charge related to this service. The patient expressed understanding and agreed to proceed.   History:  [name] is a 89 y.o. No obstetric history on file. female being evaluated today for postoperative discussion from inpatient procedure 2/2 postmenopausal bleeding. She denies any abnormal vaginal discharge, bleeding, pelvic pain or other concerns.    Pt had ultrasound guided D and C for postmenopausal bleeding performed.  The pathology showed benign endometrium and probable polyp.  There was no malignant or pre-malignant findings Pt states she has done well after the procedure and denies any further bleeding.     Past Medical History:  Diagnosis Date   Allergic rhinitis    Allergic rhinitis    Hamstring tendonitis at origin    Right hamstring   Hyperlipidemia    Hypertension    Well controlled.    Insomnia    Microcytic anemia    Baseline about 11.  MCV approx 70. Ferritin 196 in 9/08   Osteoarthritis    Bilateral knees, x ray 6/05:  Right knee- Severe tricompartmental degenerative arthritis with loose bodies.   Osteopenia    Hx, no longer osteopenic,  normal DEXA 11/07   Rectovaginal fistula    Seen during colonoscopy   Past Surgical History:  Procedure Laterality Date   CATARACT EXTRACTION, BILATERAL     DILATION AND CURETTAGE OF UTERUS N/A 05/03/2024   Procedure: DILATION AND CURETTAGE;  Surgeon: Zina Jerilynn LABOR, MD;  Location: [facility] OR;  Service: Gynecology;  Laterality: N/A;   EXAM UNDER ANESTHESIA, PELVIC  05/03/2024   Procedure: EXAM UNDER ANESTHESIA, PELVIC;  Surgeon: Zina Jerilynn LABOR, MD;  Location: [facility] OR;  Service: Gynecology;;   KNEE ARTHROSCOPY Bilateral    The following portions of the patient's history were reviewed and updated as appropriate: allergies, current medications, past family history, past medical history, past social history, past surgical history and problem list.   Health Maintenance:  up to date per age  Review of Systems:  Pertinent items noted in HPI and remainder of comprehensive ROS otherwise negative.  Physical Exam:   General:  Alert, oriented and cooperative. Patient appears to be in no acute distress.  Mental Status: Normal mood and affect. Normal behavior. Normal judgment and thought content.   Respiratory: Normal respiratory effort, no problems with respiration noted  Rest of physical exam deferred due to type of encounter  Labs and Imaging No results found for this or any previous visit (from the past 2 weeks). US  Intraoperative Result Date: 05/03/2024 CLINICAL DATA:  Ultrasound was provided for use by the ordering physician.  No provider Interpretation or professional fees incurred.    ECHOCARDIOGRAM COMPLETE Result Date: 04/27/2024    ECHOCARDIOGRAM REPORT   Patient Name:   [name] Date of Exam: 04/27/2024 Medical Rec #:  [id number]             Height:       62.0 in Accession #:  [id number]            Weight:       170.0 lb Date of Birth:  09/21/1933             BSA:          1.784 m Patient Age:    89 years              BP:           154/69 mmHg Patient Gender: F                     HR:           78 bpm. Exam Location:  Inpatient Procedure: 2D Echo, Cardiac Doppler and Color Doppler (Both Spectral and Color            Flow Doppler were utilized during procedure). Indications:    Elevated Troponin  History:        Patient has no prior  history of Echocardiogram examinations.                 Risk Factors:Dyslipidemia and Hypertension.  Sonographer:    Philomena Daring Referring Phys: CLARETTA HERO AMPONSAH IMPRESSIONS  1. Left ventricular ejection fraction, by estimation, is 60 to 65%. The left ventricle has normal function. The left ventricle has no regional wall motion abnormalities. There is moderate concentric left ventricular hypertrophy. Left ventricular diastolic parameters are consistent with Grade I diastolic dysfunction (impaired relaxation).  2. Right ventricular systolic function is normal. The right ventricular size is normal.  3. The mitral valve is normal in structure. Trivial mitral valve regurgitation. No evidence of mitral stenosis.  4. The aortic valve is tricuspid. Aortic valve regurgitation is not visualized. Aortic valve sclerosis is present, with no evidence of aortic valve stenosis.  5. The inferior vena cava is normal in size with greater than 50% respiratory variability, suggesting right atrial pressure of 3 mmHg. Comparison(s): No prior Echocardiogram. FINDINGS  Left Ventricle: Left ventricular ejection fraction, by estimation, is 60 to 65%. The left ventricle has normal function. The left ventricle has no regional wall motion abnormalities. The left ventricular internal cavity size was normal in size. There is  moderate concentric left ventricular hypertrophy. Left ventricular diastolic parameters are consistent with Grade I diastolic dysfunction (impaired relaxation). Right Ventricle: The right ventricular size is normal. Right vetricular wall thickness was not well visualized. Right ventricular systolic function is normal. Left Atrium: Left atrial size was normal in size. Right Atrium: Right atrial size was normal in size. Pericardium: Trivial pericardial effusion is present. Mitral Valve: The mitral valve is normal in structure. Trivial mitral valve regurgitation. No evidence of mitral valve stenosis. Tricuspid Valve: The  tricuspid valve is normal in structure. Tricuspid valve regurgitation is trivial. No evidence of tricuspid stenosis. Aortic Valve: The aortic valve is tricuspid. Aortic valve regurgitation is not visualized. Aortic valve sclerosis is present, with no evidence of aortic valve stenosis. Pulmonic Valve: The pulmonic valve was not well visualized. Pulmonic valve regurgitation is not visualized. No evidence of pulmonic stenosis. Aorta: The aortic root and ascending aorta are structurally normal, with no evidence of dilitation. Venous: The inferior vena cava is normal in size with greater than 50% respiratory variability, suggesting right atrial pressure of 3 mmHg. IAS/Shunts: No atrial level shunt detected by color flow Doppler.  LEFT VENTRICLE PLAX 2D LVIDd:         2.90 cm   Diastology LVIDs:  1.90 cm   LV e' medial:    5.33 cm/s LV PW:         1.20 cm   LV E/e' medial:  14.4 LV IVS:        1.60 cm   LV e' lateral:   6.42 cm/s LVOT diam:     2.00 cm   LV E/e' lateral: 12.0 LV SV:         80 LV SV Index:   45 LVOT Area:     3.14 cm  RIGHT VENTRICLE             IVC RV Basal diam:  3.00 cm     IVC diam: 1.30 cm RV Mid diam:    2.50 cm RV S prime:     13.90 cm/s TAPSE (M-mode): 2.9 cm LEFT ATRIUM             Index        RIGHT ATRIUM           Index LA diam:        2.70 cm 1.51 cm/m   RA Area:     15.20 cm LA Vol (A2C):   28.3 ml 15.86 ml/m  RA Volume:   34.00 ml  19.06 ml/m LA Vol (A4C):   28.8 ml 16.14 ml/m LA Biplane Vol: 30.0 ml 16.82 ml/m  AORTIC VALVE LVOT Vmax:   141.00 cm/s LVOT Vmean:  95.600 cm/s LVOT VTI:    0.254 m  AORTA Ao Root diam: 2.80 cm Ao Asc diam:  2.80 cm MITRAL VALVE MV Area (PHT): 4.49 cm     SHUNTS MV Decel Time: 169 msec     Systemic VTI:  0.25 m MV E velocity: 77.00 cm/s   Systemic Diam: 2.00 cm MV A velocity: 102.00 cm/s MV E/A ratio:  0.75 Emeline Calender Electronically signed by Emeline Calender Signature Date/Time: 04/27/2024/2:27:04 PM    Final    CT ABDOMEN PELVIS W CONTRAST Result  Date: 04/25/2024 CLINICAL DATA:  Suprapubic pain with vaginal versus rectal bleeding EXAM: CT ABDOMEN AND PELVIS WITH CONTRAST TECHNIQUE: Multidetector CT imaging of the abdomen and pelvis was performed using the standard protocol following bolus administration of intravenous contrast. RADIATION DOSE REDUCTION: This exam was performed according to the departmental dose-optimization program which includes automated exposure control, adjustment of the mA and/or kV according to patient size and/or use of iterative reconstruction technique. CONTRAST:  75mL OMNIPAQUE IOHEXOL 350 MG/ML SOLN COMPARISON:  None Available. FINDINGS: Lower chest: No acute pleural or parenchymal lung disease. Hepatobiliary: Cholecystectomy. Intrahepatic and extrahepatic biliary duct dilation likely related to prior cholecystectomy, with common bile duct measuring up to 13 mm. No focal liver abnormalities. Pancreas: Unremarkable. No pancreatic ductal dilatation or surrounding inflammatory changes. Spleen: Normal in size without focal abnormality. Adrenals/Urinary Tract: Multiple bilateral renal cortical cysts do not require specific imaging follow-up. No urinary tract calculi or obstructive uropathy. Bladder is unremarkable with no wall thickening or filling defect. The adrenals are normal. Stomach/Bowel: No bowel obstruction or ileus. Moderate stool throughout the colon. No bowel wall thickening or inflammatory change. The appendix, if still present, is not well visualized. Vascular/Lymphatic: Aortic atherosclerosis. No enlarged abdominal or pelvic lymph nodes. Reproductive: Multiple calcified and noncalcified uterine fibroids. Heterogeneous endometrial thickening measuring up to 2.3 cm. No adnexal masses. Other: No free fluid or free intraperitoneal gas. No abdominal wall hernia. Musculoskeletal: Severe bilateral hip osteoarthritis. No acute displaced fractures. Diffuse thoracolumbar spondylosis and facet hypertrophy. Reconstructed images  demonstrate no  additional findings. IMPRESSION: 1. Abnormal endometrial thickening measuring up to 2.3 cm. Underlying endometrial carcinoma is a concern given history of possible vaginal bleeding. Endometrial sampling is recommended. 2. Moderate stool throughout the colon consistent with constipation. No bowel obstruction or ileus. 3. Cholecystectomy, with postsurgical dilatation of the biliary tree. 4. Fibroid uterus. 5. Severe bilateral hip osteoarthritis. 6.  Aortic Atherosclerosis (ICD10-I70.0). Electronically Signed   By: Ozell Daring M.D.   On: 04/25/2024 21:10       Assessment and Plan:     Encounter for postoperative care Postmenopausal bleeding      I discussed the assessment and treatment plan with the patient. The patient was provided an opportunity to ask questions and all were answered. The patient agreed with the plan and demonstrated an understanding of the instructions.   The patient was advised to call back or seek an in-person evaluation/go to the ED if the symptoms worsen or if the condition fails to improve as anticipated.  I provided 5 minutes of face-to-face time during this encounter. I also spent 5 minutes dedicated to the care of this patient including pre-visit review of records, post visit ordering of medications and appropriate tests or procedures, coordinating care and documenting this visit encounter.  Pt may follow up prn  Jerilynn DELENA Buddle, MD [facility] [employer]

## 2024-08-12 ENCOUNTER — Ambulatory Visit: Admitting: Podiatry

## 2024-09-02 ENCOUNTER — Ambulatory Visit: Admitting: Podiatry

## 2024-10-20 ENCOUNTER — Ambulatory Visit: Payer: Self-pay | Admitting: Rheumatology
